# Patient Record
Sex: Male | Born: 1950 | Race: White | Hispanic: No | Marital: Single | Smoking: Current every day smoker
Health system: Southern US, Community
[De-identification: ages and names within clinical notes are randomized; demographics above are authoritative.]

## PROBLEM LIST (undated history)

## (undated) DIAGNOSIS — K279 Peptic ulcer, site unspecified, unspecified as acute or chronic, without hemorrhage or perforation: Secondary | ICD-10-CM

## (undated) DIAGNOSIS — K275 Chronic or unspecified peptic ulcer, site unspecified, with perforation: Secondary | ICD-10-CM

## (undated) HISTORY — PX: APPENDECTOMY: SHX54

---

## 2002-09-08 ENCOUNTER — Encounter: Payer: Self-pay | Admitting: Emergency Medicine

## 2002-09-08 ENCOUNTER — Inpatient Hospital Stay (HOSPITAL_COMMUNITY): Admission: EM | Admit: 2002-09-08 | Discharge: 2002-09-12 | Payer: Self-pay | Admitting: Emergency Medicine

## 2002-09-09 ENCOUNTER — Encounter (INDEPENDENT_AMBULATORY_CARE_PROVIDER_SITE_OTHER): Payer: Self-pay | Admitting: *Deleted

## 2011-01-21 NOTE — Discharge Summary (Signed)
NAME:  Chad Wiggins, Chad Wiggins NO.:  [id number]   MEDICAL RECORD NO.:  [id number]                   PATIENT TYPE:  INP   LOCATION:  5735                                 FACILITY:  [facility]   PHYSICIAN:  Sharlet Salina T. Hoxworth, M.D.          DATE OF BIRTH:  16-Jan-1951   DATE OF ADMISSION:  09/07/2002  DATE OF DISCHARGE:  09/12/2002                                 DISCHARGE SUMMARY   DISCHARGE DIAGNOSES:  Perforated appendicitis with abscess.   OPERATIONS AND PROCEDURES:  Appendectomy - September 08, 2002.   HISTORY OF PRESENT ILLNESS:  This patient is a 60 year old white male with a  six-day history of persistent right lower quadrant and periumbilical  abdominal pain, not severe.  He has been nauseated and vomited twice within  24 hours prior to admission.  Bowel movements have been normal.  He has felt  somewhat feverish.  He states he had similar pain about six months ago for  three days that resolved.   PAST MEDICAL HISTORY:  Without previous significant medical problems,  hospitalizations, or surgery.   MEDICATIONS:  None.   ALLERGIES:  CODEINE.   SOCIAL HISTORY, FAMILY HISTORY, REVIEW OF SYSTEMS:  See detailed H&P.   PERTINENT PHYSICAL EXAMINATION:  VITAL SIGNS:  He is afebrile and vital  signs all within normal limits.  GENERAL:  Well-nourished, well-developed white male in no distress.  ABDOMEN:  Showed some mild distension.  Moderate tenderness in the right  lower quadrant with some guarding and no mass.   LABORATORY DATA:  CBC, CMET, and urinalysis all within normal limits.  CT  scan of the abdomen and pelvis was obtained which showed an inflammatory  process around the tip of the cecum with about a 3 cm abscess consistent  with perforated appendectomy.  There was also evidence of small bowel  obstruction with dilated small bowel.   HOSPITAL COURSE:  The patient was taken to the operating room on day of  admission.  He was given broad-spectrum  antibiotics.  He underwent open  appendectomy with findings of perforated appendicitis with abscess and  kinking of the small bowel related to the inflammatory process resulting in  small bowel obstruction.  His postoperative course was relatively smooth.  He had some fever for 24-48 hours that gradually resolved.  He was treated  with IV Unasyn postoperatively.  He did have some urinary retention that  responded to Flomax after I&O catheterization for about 24 hours.  By  postoperative day #3 he had had a couple of loose bowel movements and had no  nausea, and was begun on clear liquids which he tolerated well.  He is  afebrile at this time.  By January 8 he was tolerating a diet and was  afebrile, abdomen nontender.  He was discharged home at this point.  Wound  was healing nicely.    FOLLOW-UP:  With me in my office in 7-10 days.  FINAL PATHOLOGY:  Confirmed acute appendicitis with perforation.                                               Chad Wiggins. Hoxworth, M.D.    Tory Emerald  D:  10/08/2002  T:  10/08/2002  Job:  [id number]

## 2011-01-21 NOTE — Op Note (Signed)
NAME:  Chad Wiggins, Chad Wiggins NO.:  [id number]   MEDICAL RECORD NO.:  [id number]                   PATIENT TYPE:  INP   LOCATION:  5735                                 FACILITY:  [facility]   PHYSICIAN:  Sharlet Salina T. Hoxworth, M.D.          DATE OF BIRTH:  1951/05/18   DATE OF PROCEDURE:  DATE OF DISCHARGE:                                 OPERATIVE REPORT   PREOPERATIVE DIAGNOSIS:  Perforated appendicitis with abscess.   POSTOPERATIVE DIAGNOSIS:  Perforated appendicitis with abscess.   SURGICAL PROCEDURE:  Appendectomy.   SURGEON:  [name]. Hoxworth, M.D.   ASSISTANT:  Jimmye Norman, M.D.   ANESTHESIA:  General.   BRIEF HISTORY:  The patient is a 60 year-old white male who presents with a  six day history of persistent right lower quadrant abdominal pain and  increasing nausea and vomiting.  He has had a CT scan revealing an  inflammatory process in the right lower quadrant with a 3 cm abscess  consistent with perforated appendicitis and abscess as well as evidence of a  small bowel obstruction.  Open appendectomy has been recommended and  accepted.  The nature of the procedure, its indications and risks of  bleeding and infection were discussed and understood properly.  He is now  brought to the operating room for this procedure.   DESCRIPTION OF PROCEDURE:  The patient was brought to the operating room,  placed in the supine position on the operating table and general  endotracheal anesthesia was induced.  He had received IV antibiotics  preoperatively. A Foley catheter was placed.  The abdomen was sterilely  prepped and draped.  An oblique incision was made in the right lower  quadrant and dissection was carried down through the subcutaneous tissue.  The external oblique was divided along the lines of its fibers.  The  internal oblique was bluntly split.  The transversus and perineum were  elevated and sharply entered.  There was a small amount of  clear fluid  drained.  I bluntly dissected the omentum at the right lower quadrant and  the abscess cavity entered with thick purulent material.  This was cultured.  This was all suctioned and irrigated.  In the center of the abscess was a  necrotic appendix.  This and the cecum elevated up quite easily and nicely  into the wound and the distal appendix with the cecum and proximal appendix  relatively free of any marked inflammation. The mesoappendix was dissected  away from the appendix and its base and divided between hemostats and tied  with 3-0 silk ties until the appendix was completely free down to its base.  At this point a 3-0 silk pursestring suture was placed around the base of  the appendix and the cecum and the appendix was clamped and tied with 2-0  chromic and divided and removed.  The stump was inverted and the pursestring  suture secured as  well as a Z stitch placed on top of this.  Following this  any further loculations were completely broken up in the right lower  quadrant and the right lower quadrant thoroughly irrigated with saline.  The  distal small bowel was carefully inspected and there was an area of  inflammatory adhesion which had been previously broken up about 6 or 7 cm  from the ileocecal valve which appeared to be the point of obstruction.  It  was confirmed that the small bowel was completely free at this point.  The  viscera were returned to their anatomic position.  The peritoneum and  transversus were closed with running 2-0 PDS.  The external oblique was  closed with running 0 PDS.  The soft  tissue was copiously irrigated with antibiotic solution.  The skin was  loosely closed with staples.  Sponge, needle and instrument counts were  correct.  A sterile bandage was applied.  The patient was taken to the  recovery room in good condition.                                               [name]. Hoxworth, M.D.    Tory Emerald  D:  09/08/2002  T:   09/08/2002  Job:  [id number]

## 2013-12-18 ENCOUNTER — Emergency Department (HOSPITAL_COMMUNITY): Payer: Non-veteran care

## 2013-12-18 ENCOUNTER — Emergency Department (HOSPITAL_COMMUNITY)
Admission: EM | Admit: 2013-12-18 | Discharge: 2013-12-18 | Disposition: A | Discharge status: Home / Self Care | Payer: Self-pay | Attending: Emergency Medicine | Admitting: Emergency Medicine

## 2013-12-18 ENCOUNTER — Encounter (HOSPITAL_COMMUNITY): Payer: Self-pay | Admitting: Emergency Medicine

## 2013-12-18 DIAGNOSIS — Y929 Unspecified place or not applicable: Secondary | ICD-10-CM | POA: Insufficient documentation

## 2013-12-18 DIAGNOSIS — Y939 Activity, unspecified: Secondary | ICD-10-CM | POA: Insufficient documentation

## 2013-12-18 DIAGNOSIS — Z79899 Other long term (current) drug therapy: Secondary | ICD-10-CM | POA: Insufficient documentation

## 2013-12-18 DIAGNOSIS — S20229A Contusion of unspecified back wall of thorax, initial encounter: Secondary | ICD-10-CM | POA: Insufficient documentation

## 2013-12-18 DIAGNOSIS — R0602 Shortness of breath: Secondary | ICD-10-CM | POA: Insufficient documentation

## 2013-12-18 DIAGNOSIS — W19XXXA Unspecified fall, initial encounter: Secondary | ICD-10-CM

## 2013-12-18 DIAGNOSIS — F17200 Nicotine dependence, unspecified, uncomplicated: Secondary | ICD-10-CM | POA: Insufficient documentation

## 2013-12-18 DIAGNOSIS — W11XXXA Fall on and from ladder, initial encounter: Secondary | ICD-10-CM | POA: Insufficient documentation

## 2013-12-18 MED ORDER — NAPROXEN 500 MG PO TABS: 500.0000 mg | ORAL_TABLET | Freq: Two times a day (BID) | ORAL | Status: DC

## 2013-12-18 MED ORDER — MORPHINE SULFATE 4 MG/ML IJ SOLN
4.0000 mg | INTRAMUSCULAR | Status: AC
  Administered 2013-12-18: 4 mg via INTRAMUSCULAR
  Filled 2013-12-18: qty 1

## 2013-12-18 NOTE — ED Provider Notes (Signed)
CSN: [id number]     Arrival date & time 12/18/13  0207 History   First MD Initiated Contact with Patient 12/18/13 [phone]     Chief Complaint  Patient presents with  . Fall     (Consider location/radiation/quality/duration/timing/severity/associated sxs/prior Treatment) HPI Comments: 63 year old male, states that he fell off a ladder at approximately 14-16 feet today. He landed flat on his back mostly on his left shoulder blade. He states that the breath got knocked out of him, he has had some shortness of breath and left upper back pain since that time, worse with moving his left arm and deep breathing. Symptoms are persistent, moderate to severe, not associated with head injury neck pain numbness weakness or difficulty walking.  Patient is a 63 y.o. male presenting with fall. The history is provided by the patient.  Fall    History reviewed. No pertinent past medical history. Past Surgical History  Procedure Laterality Date  . Appendectomy     No family history on file. History  Substance Use Topics  . Smoking status: Current Every Day Smoker -- 1.00 packs/day    Types: Cigarettes  . Smokeless tobacco: Not on file  . Alcohol Use: 10.8 oz/week    18 Cans of beer per week    Review of Systems  All other systems reviewed and are negative.     Allergies  Hydrocodone and Oxycodone  Home Medications   Prior to Admission medications   Medication Sig Start Date End Date Taking? Authorizing Provider  ALPRAZolam Prudy Feeler(XANAX) 0.5 MG tablet Take 0.5 mg by mouth once.   Yes Historical Provider, MD  Aspirin-Acetaminophen-Caffeine (GOODY HEADACHE PO) Take 1 packet by mouth 2 (two) times daily as needed (for pain).   Yes Historical Provider, MD  ibuprofen (ADVIL,MOTRIN) 200 MG tablet Take 200 mg by mouth every 6 (six) hours as needed for moderate pain.   Yes Historical Provider, MD   BP 142/105  Pulse 77  Temp(Src) 98.1 F (36.7 C) (Oral)  Resp 18  Ht 6\' 6"  (1.981 m)  Wt 215 lb  (97.523 kg)  BMI 24.85 kg/m2  SpO2 95% Physical Exam  Nursing note and vitals reviewed. Constitutional: He appears well-developed and well-nourished. No distress.  HENT:  Head: Normocephalic and atraumatic.  Mouth/Throat: Oropharynx is clear and moist. No oropharyngeal exudate.  no facial tenderness, deformity, malocclusion or hemotympanum.  no battle's sign or racoon eyes.   Eyes: Conjunctivae and EOM are normal. Pupils are equal, round, and reactive to light. Right eye exhibits no discharge. Left eye exhibits no discharge. No scleral icterus.  Neck: Normal range of motion. Neck supple. No JVD present. No thyromegaly present.  Cardiovascular: Normal rate, regular rhythm, normal heart sounds and intact distal pulses.  Exam reveals no gallop and no friction rub.   No murmur heard. Pulmonary/Chest: Effort normal and breath sounds normal. No respiratory distress. He has no wheezes. He has no rales.  Abdominal: Soft. Bowel sounds are normal. He exhibits no distension and no mass. There is no tenderness.  Musculoskeletal: Normal range of motion. He exhibits tenderness ( Focal tenderness to palpation over the left scapula, left rhomboid, no spinal tenderness over the cervical thoracic or lumbar spines.). He exhibits no edema.  Lymphadenopathy:    He has no cervical adenopathy.  Neurological: He is alert. Coordination normal.  Normal strength and sensation in all 4 extremities, gait normal  Skin: Skin is warm and dry. No rash noted. No erythema.  Psychiatric: He has a normal mood and  affect. His behavior is normal.    ED Course  Procedures (including critical care time) Labs Review Labs Reviewed - No data to display  Imaging Review Dg Ribs Unilateral W/chest Left  12/18/2013   CLINICAL DATA:  Patient fell off a ladder yesterday. Posterior rib pain.  EXAM: LEFT RIBS AND CHEST - 3+ VIEW  COMPARISON:  None.  FINDINGS: No fracture or other bone lesions are seen involving the ribs. There is no  evidence of pneumothorax or pleural effusion. There is bibasilar atelectasis. Both lungs are clear. Heart size and mediastinal contours are within normal limits.  IMPRESSION: No acute osseous injury of the left ribs.   Electronically Signed   By: Elige KoHetal  Patel   On: 12/18/2013 03:21   Dg Scapula Left  12/18/2013   CLINICAL DATA:  Patient fell from a ladder yesterday. Pain over the scapula.  EXAM: LEFT SCAPULA - 2+ VIEWS  COMPARISON:  None.  FINDINGS: There is no evidence of fracture or other focal bone lesions. Soft tissues are unremarkable.  IMPRESSION: Negative.   Electronically Signed   By: Elige KoHetal  Patel   On: 12/18/2013 03:21      MDM   Final diagnoses:  Contusion of back  Fall    Some shortness of breath with deep breathing, and no palpable crepitance over the spine or back, no subcutaneous emphysema, sats 95% on room air the patient does not want to take a deep breath. Imaging to rule out pneumothorax, rib fracture, scapular injury. Intramuscular morphine ordered, patient states he can take to safely  X-rays negative for fracture, pneumothorax or other acute injury. Medications given as below, patient states he has allergy to oxycodone and hydrocodone, he can't take anti-inflammatory safely. Sling provided for comfort   Meds given in ED:  Medications  morphine 4 MG/ML injection 4 mg (4 mg Intramuscular Given 12/18/13 0301)    New Prescriptions   NAPROXEN (NAPROSYN) 500 MG TABLET    Take 1 tablet (500 mg total) by mouth 2 (two) times daily with a meal.      Vida RollerBrian D Rishawn Walck, MD 12/18/13 [phone]

## 2013-12-18 NOTE — ED Notes (Signed)
Patient transported to X-ray 

## 2013-12-18 NOTE — ED Notes (Signed)
Per EMS, pt had fall from 16 feet ladder today at 1630. Pt reports that when he fell it knocked the breath out of him. The patient is now experiencing left shoulder pain and abd pain with movement and when coughing. Pt alert x 4. NAD at this time. [facility] but "the wait wait was to long."

## 2015-02-25 ENCOUNTER — Emergency Department (HOSPITAL_COMMUNITY): Payer: Non-veteran care

## 2015-02-25 ENCOUNTER — Inpatient Hospital Stay (HOSPITAL_COMMUNITY)
Admission: EM | Admit: 2015-02-25 | Discharge: 2015-03-04 | DRG: 327 | Disposition: A | Discharge status: Home / Self Care | Payer: Non-veteran care | Attending: General Surgery | Admitting: General Surgery

## 2015-02-25 ENCOUNTER — Encounter (HOSPITAL_COMMUNITY): Payer: Self-pay | Admitting: Emergency Medicine

## 2015-02-25 DIAGNOSIS — K279 Peptic ulcer, site unspecified, unspecified as acute or chronic, without hemorrhage or perforation: Secondary | ICD-10-CM | POA: Diagnosis present

## 2015-02-25 DIAGNOSIS — N289 Disorder of kidney and ureter, unspecified: Secondary | ICD-10-CM | POA: Diagnosis present

## 2015-02-25 DIAGNOSIS — K5900 Constipation, unspecified: Secondary | ICD-10-CM | POA: Diagnosis present

## 2015-02-25 DIAGNOSIS — K631 Perforation of intestine (nontraumatic): Secondary | ICD-10-CM

## 2015-02-25 DIAGNOSIS — R198 Other specified symptoms and signs involving the digestive system and abdomen: Secondary | ICD-10-CM | POA: Diagnosis present

## 2015-02-25 DIAGNOSIS — D72829 Elevated white blood cell count, unspecified: Secondary | ICD-10-CM | POA: Diagnosis present

## 2015-02-25 DIAGNOSIS — K255 Chronic or unspecified gastric ulcer with perforation: Secondary | ICD-10-CM | POA: Diagnosis not present

## 2015-02-25 DIAGNOSIS — E875 Hyperkalemia: Secondary | ICD-10-CM | POA: Diagnosis not present

## 2015-02-25 DIAGNOSIS — E876 Hypokalemia: Secondary | ICD-10-CM | POA: Diagnosis not present

## 2015-02-25 DIAGNOSIS — R6510 Systemic inflammatory response syndrome (SIRS) of non-infectious origin without acute organ dysfunction: Secondary | ICD-10-CM | POA: Diagnosis present

## 2015-02-25 DIAGNOSIS — R109 Unspecified abdominal pain: Secondary | ICD-10-CM

## 2015-02-25 DIAGNOSIS — E861 Hypovolemia: Secondary | ICD-10-CM | POA: Diagnosis present

## 2015-02-25 DIAGNOSIS — R1010 Upper abdominal pain, unspecified: Secondary | ICD-10-CM | POA: Diagnosis not present

## 2015-02-25 DIAGNOSIS — F17210 Nicotine dependence, cigarettes, uncomplicated: Secondary | ICD-10-CM | POA: Diagnosis present

## 2015-02-25 DIAGNOSIS — Z720 Tobacco use: Secondary | ICD-10-CM | POA: Diagnosis present

## 2015-02-25 DIAGNOSIS — N179 Acute kidney failure, unspecified: Secondary | ICD-10-CM | POA: Diagnosis present

## 2015-02-25 DIAGNOSIS — Z791 Long term (current) use of non-steroidal anti-inflammatories (NSAID): Secondary | ICD-10-CM

## 2015-02-25 HISTORY — DX: Chronic or unspecified peptic ulcer, site unspecified, with perforation: K27.5

## 2015-02-25 HISTORY — DX: Peptic ulcer, site unspecified, unspecified as acute or chronic, without hemorrhage or perforation: K27.9

## 2015-02-25 LAB — CBC WITH DIFFERENTIAL/PLATELET
Basophils Absolute: 0 10*3/uL (ref 0.0–0.1)
Basophils Relative: 0 % (ref 0–1)
EOS PCT: 0 % (ref 0–5)
Eosinophils Absolute: 0 10*3/uL (ref 0.0–0.7)
HCT: 53.7 % — ABNORMAL HIGH (ref 39.0–52.0)
Hemoglobin: 18.7 g/dL — ABNORMAL HIGH (ref 13.0–17.0)
LYMPHS ABS: 1.1 10*3/uL (ref 0.7–4.0)
LYMPHS PCT: 12 % (ref 12–46)
MCH: 33.2 pg (ref 26.0–34.0)
MCHC: 34.8 g/dL (ref 30.0–36.0)
MCV: 95.4 fL (ref 78.0–100.0)
Monocytes Absolute: 0.5 10*3/uL (ref 0.1–1.0)
Monocytes Relative: 6 % (ref 3–12)
Neutro Abs: 7.5 10*3/uL (ref 1.7–7.7)
Neutrophils Relative %: 82 % — ABNORMAL HIGH (ref 43–77)
PLATELETS: 227 10*3/uL (ref 150–400)
RBC: 5.63 MIL/uL (ref 4.22–5.81)
RDW: 13.2 % (ref 11.5–15.5)
WBC: 9.2 10*3/uL (ref 4.0–10.5)

## 2015-02-25 MED ORDER — HYDROMORPHONE HCL 1 MG/ML IJ SOLN
1.0000 mg | Freq: Once | INTRAMUSCULAR | Status: AC
  Administered 2015-02-25: 1 mg via INTRAVENOUS
  Filled 2015-02-25: qty 1

## 2015-02-25 MED ORDER — HYDROMORPHONE HCL 1 MG/ML IJ SOLN
1.0000 mg | Freq: Once | INTRAMUSCULAR | Status: AC
  Administered 2015-02-26: 1 mg via INTRAVENOUS
  Filled 2015-02-25: qty 1

## 2015-02-25 MED ORDER — FENTANYL CITRATE (PF) 100 MCG/2ML IJ SOLN
50.0000 ug | Freq: Once | INTRAMUSCULAR | Status: AC
  Administered 2015-02-25: 50 ug via INTRAVENOUS
  Filled 2015-02-25: qty 2

## 2015-02-25 MED ORDER — SODIUM CHLORIDE 0.9 % IV BOLUS (SEPSIS)
500.0000 mL | Freq: Once | INTRAVENOUS | Status: AC
  Administered 2015-02-25: 500 mL via INTRAVENOUS

## 2015-02-25 NOTE — ED Notes (Signed)
MD Yelverton at bedside. 

## 2015-02-25 NOTE — ED Provider Notes (Signed)
CSN: [id number]     Arrival date & time 02/25/15  2214 History  This chart was scribed for Loren Racer, MD by Bronson Curb, ED Scribe. This patient was seen in room D34C/D34C and the patient's care was started at 11:08 PM.   Chief Complaint  Patient presents with  . Abdominal Pain    The history is provided by the patient. No language interpreter was used.     HPI Comments: Chad Wiggins is a 64 y.o. male who presents to the Emergency Department complaining of gradually worsening, upper abdominal pain for the past few weeks, that has gotten worse in the last 5 hours and become more diffuse. There is associated nausea, vomiting, abdominal distention, and diaphoresis. Patient has not been able to tolerate PO and denies flatulence. He reports his last BM was "a couple of days ago". He denies fever, chills, or diarrhea. History of prior appendectomy.   History reviewed. No pertinent past medical history. Past Surgical History  Procedure Laterality Date  . Appendectomy     History reviewed. No pertinent family history. History  Substance Use Topics  . Smoking status: Current Every Day Smoker -- 1.00 packs/day    Types: Cigarettes  . Smokeless tobacco: Not on file  . Alcohol Use: 10.8 oz/week    18 Cans of beer per week    Review of Systems  Constitutional: Negative for fever and chills.  Respiratory: Negative for shortness of breath.   Cardiovascular: Negative for chest pain.  Gastrointestinal: Positive for nausea, vomiting, abdominal pain, constipation and abdominal distention.  Genitourinary: Negative for dysuria, frequency and flank pain.  Musculoskeletal: Negative for back pain, neck pain and neck stiffness.  Skin: Negative for rash and wound.  Neurological: Negative for dizziness, weakness, light-headedness, numbness and headaches.  All other systems reviewed and are negative.     Allergies  Hydrocodone and Oxycodone  Home Medications   Prior to Admission  medications   Medication Sig Start Date End Date Taking? Authorizing Provider  ALPRAZolam Prudy Feeler) 0.5 MG tablet Take 0.5 mg by mouth once.    Historical Provider, MD  Aspirin-Acetaminophen-Caffeine (GOODY HEADACHE PO) Take 1 packet by mouth 2 (two) times daily as needed (for pain).    Historical Provider, MD  ibuprofen (ADVIL,MOTRIN) 200 MG tablet Take 200 mg by mouth every 6 (six) hours as needed for moderate pain.    Historical Provider, MD  naproxen (NAPROSYN) 500 MG tablet Take 1 tablet (500 mg total) by mouth 2 (two) times daily with a meal. 12/18/13   Eber Hong, MD   Triage Vitals: BP 136/100 mmHg  Pulse 122  Temp(Src) 97.5 F (36.4 C) (Oral)  Resp 18  SpO2 96%  Physical Exam  Constitutional: He is oriented to person, place, and time. He appears well-developed and well-nourished. He appears distressed.  Sitting up. Uncomfortable appearing  HENT:  Head: Normocephalic and atraumatic.  Mouth/Throat: Oropharynx is clear and moist.  Eyes: EOM are normal. Pupils are equal, round, and reactive to light.  Neck: Normal range of motion. Neck supple.  Cardiovascular: Normal rate and regular rhythm.   Pulmonary/Chest: Effort normal and breath sounds normal. No respiratory distress. He has no wheezes. He has no rales.  Abdominal: Soft. He exhibits distension. There is tenderness.  Decreased bowel sounds throughout. Abdomen is distended and firm. Diffuse tenderness. No definite peritoneal signs  Musculoskeletal: Normal range of motion. He exhibits no edema or tenderness.  No CVA tenderness bilaterally  Neurological: He is alert and oriented to person,  place, and time.  Moves all extremities without deficit. Sensation is grossly intact.  Skin: Skin is warm and dry. No rash noted. No erythema.  Psychiatric: He has a normal mood and affect. His behavior is normal.  Nursing note and vitals reviewed.   ED Course  Procedures (including critical care time)  DIAGNOSTIC STUDIES: Oxygen  Saturation is 96% on room air, adequate by my interpretation.    COORDINATION OF CARE: At 2312 Discussed treatment plan with patient which includes pain medication. Patient agrees.   Labs Review Labs Reviewed  CBC WITH DIFFERENTIAL/PLATELET - Abnormal; Notable for the following:    Hemoglobin 18.7 (*)    HCT 53.7 (*)    Neutrophils Relative % 82 (*)    All other components within normal limits  COMPREHENSIVE METABOLIC PANEL - Abnormal; Notable for the following:    CO2 21 (*)    Glucose, Bld 161 (*)    BUN 24 (*)    Creatinine, Ser 2.06 (*)    Calcium 10.4 (*)    Total Protein 8.5 (*)    GFR calc non Af Amer 33 (*)    GFR calc Af Amer 38 (*)    All other components within normal limits  LIPASE, BLOOD  OCCULT BLOOD GASTRIC / DUODENUM (SPECIMEN CUP)  URINALYSIS, ROUTINE W REFLEX MICROSCOPIC (NOT AT [facility])    Imaging Review Ct Abdomen Pelvis Wo Contrast  02/26/2015   CLINICAL DATA:  Subacute onset of generalized abdominal pain for 2 weeks. Nausea, vomiting and abdominal distention. Initial encounter.  EXAM: CT ABDOMEN AND PELVIS WITHOUT CONTRAST  TECHNIQUE: Multidetector CT imaging of the abdomen and pelvis was performed following the standard protocol without IV contrast.  COMPARISON:  None.  FINDINGS: Mild bibasilar atelectasis or scarring is seen.  Significant free intra-abdominal air is noted within the abdomen, with free fluid of borderline high attenuation tracking about the liver and in the pelvis. This is compatible with perforation. The site of perforation appears to be at the anterior aspect of the antrum of the stomach. An underlying ulceration cannot be excluded.  Scattered nonspecific hypodensities within the liver measure up to 1.6 cm in size. The spleen is unremarkable in appearance. The pancreas and adrenal glands are within normal limits. The gallbladder is grossly unremarkable.  The kidneys are unremarkable in appearance. There is no evidence of hydronephrosis. No renal or  ureteral stones are seen. No perinephric stranding is appreciated.  No free fluid is identified. The small bowel is unremarkable in appearance. The stomach is within normal limits. No acute vascular abnormalities are seen.  The appendix is not definitely characterized; there is no evidence of appendicitis. The colon is unremarkable in appearance.  The bladder is mildly distended and grossly unremarkable. The prostate is normal in size. No inguinal lymphadenopathy is seen.  No acute osseous abnormalities are identified.  IMPRESSION: 1. Acute bowel perforation, with significant free intra-abdominal air and a small to moderate amount of free fluid about the liver and in the pelvis. This appears to arise from the anterior aspect of the antrum of the stomach. An underlying ulceration cannot be excluded. 2. Mild bibasilar atelectasis or scarring noted. 3. Nonspecific hypodensities within the liver, measuring up to 1.6 cm in size. The LFTs remain normal; dynamic liver protocol MRI or CT could be considered, on an elective nonemergent basis.  Critical Value/emergent results were called by telephone at the time of interpretation on 02/26/2015 at 2:42 am to Dr. Loren Racer, who verbally acknowledged these results.  Electronically Signed   By: Roanna Raider M.D.   On: 02/26/2015 02:50   Dg Abd Portable 1v  02/26/2015   CLINICAL DATA:  Enteric tube repositioning.  Initial encounter.  EXAM: PORTABLE ABDOMEN - 1 VIEW  COMPARISON:  Abdominal radiograph performed earlier today at 11:37 p.m.  FINDINGS: The patient's enteric tube is noted ending overlying the body of the stomach, though it extends somewhat more lateral than expected.  The visualized bowel gas pattern is grossly unremarkable. A small to moderate amount of stool is noted in the colon.  Crescentic air is again noted under the left hemidiaphragm. A decubitus view would be helpful for further evaluation.  No acute osseous abnormalities are seen. Mild right basilar  opacities likely reflect atelectasis.  IMPRESSION: 1. Enteric tube noted ending overlying the body of the stomach, though it extends somewhat more lateral than expected. 2. Crescentic air again noted underlying the left hemidiaphragm. A decubitus view would be helpful for further evaluation, to assess for free intra-abdominal air.  The findings were discussed at 12:48 a.m. on 02/26/2015 with [name] RN, by Dr. Earlene Plater.   Electronically Signed   By: Roanna Raider M.D.   On: 02/26/2015 01:02   Dg Abd Portable 1v  02/26/2015   CLINICAL DATA:  NG tube placement.  EXAM: PORTABLE ABDOMEN - 1 VIEW  COMPARISON:  None.  FINDINGS: NG tube is coiled within the distal esophagus. Nonobstructed bowel gas pattern. Stool throughout the colon. Crescentic lucency under the left hemidiaphragm. Regional skeleton unremarkable.  IMPRESSION: Crescentic lucency under the left hemidiaphragm, potentially representing free intraperitoneal air. Recommend further evaluation with dedicated abdominal radiograph and decubitus views.  NG tube is coiled within the esophagus.  Recommend repositioning.  These results were called by telephone at the time of interpretation on 02/26/2015 at 12:48 am to [name], RN, who verbally acknowledged these results.   Electronically Signed   By: Annia Belt M.D.   On: 02/26/2015 00:49     EKG Interpretation   Date/Time:  Thursday February 26 2015 02:45:25 EDT Ventricular Rate:  120 PR Interval:  170 QRS Duration: 82 QT Interval:  307 QTC Calculation: 434 R Axis:   104 Text Interpretation:  Sinus tachycardia Left atrial enlargement Right axis  deviation Confirmed by Ranae Palms  MD, [place] Lohmeyer ([id number]) on 02/26/2015 2:58:08  AM     CRITICAL CARE Performed by: Ranae Palms, Dajana Gehrig Total critical care time: 20  min Critical care time was exclusive of separately billable procedures and treating other patients. Critical care was necessary to treat or prevent imminent or life-threatening  deterioration. Critical care was time spent personally by me on the following activities: development of treatment plan with patient and/or surrogate as well as nursing, discussions with consultants, evaluation of patient's response to treatment, examination of patient, obtaining history from patient or surrogate, ordering and performing treatments and interventions, ordering and review of laboratory studies, ordering and review of radiographic studies, pulse oximetry and re-evaluation of patient's condition.  MDM   Final diagnoses:  Abdominal pain  Bowel perforation    I personally performed the services described in this documentation, which was scribed in my presence. The recorded information has been reviewed and is accurate.  Patient with evidence of bowel obstruction but perforation is also a possibility. CT scan of the abdomen. Patient's pain will be treated with IV Dilaudid. Also will place NG tube for symptomatic relief.  Further questioning patient states that he developed upper abdominal pain after heavy drinking. He has been  taking Goody powders for the abdominal pain. CT scan consistent with acute perforation. Suspect the side of the perforation is the antrum of the stomach. Discussed with Dr. Donell Beers. She will admit for surgery. IV antibiotic started in the emergency department.   Loren Racer, MD 02/26/15 [phone]

## 2015-02-25 NOTE — ED Notes (Signed)
Pt arrives with abdominal pain ongoing for two weeks, worse today. States at 1900, sudden sensation of pain dropping from upper abdomen to lower abdomen. Unable to sit still in triage. 4MG  Zofran given by EMS. N/V, constipation.

## 2015-02-26 ENCOUNTER — Emergency Department (HOSPITAL_COMMUNITY): Payer: Non-veteran care | Admitting: Certified Registered"

## 2015-02-26 ENCOUNTER — Emergency Department (HOSPITAL_COMMUNITY): Payer: Non-veteran care

## 2015-02-26 ENCOUNTER — Encounter (HOSPITAL_COMMUNITY): Admission: EM | Disposition: A | Discharge status: Home / Self Care | Payer: Self-pay | Source: Home / Self Care

## 2015-02-26 ENCOUNTER — Encounter (HOSPITAL_COMMUNITY): Payer: Self-pay | Admitting: Radiology

## 2015-02-26 DIAGNOSIS — K275 Chronic or unspecified peptic ulcer, site unspecified, with perforation: Secondary | ICD-10-CM

## 2015-02-26 DIAGNOSIS — D72829 Elevated white blood cell count, unspecified: Secondary | ICD-10-CM | POA: Diagnosis present

## 2015-02-26 DIAGNOSIS — K255 Chronic or unspecified gastric ulcer with perforation: Secondary | ICD-10-CM | POA: Diagnosis present

## 2015-02-26 DIAGNOSIS — E875 Hyperkalemia: Secondary | ICD-10-CM | POA: Diagnosis not present

## 2015-02-26 DIAGNOSIS — F17210 Nicotine dependence, cigarettes, uncomplicated: Secondary | ICD-10-CM | POA: Diagnosis present

## 2015-02-26 DIAGNOSIS — Z791 Long term (current) use of non-steroidal anti-inflammatories (NSAID): Secondary | ICD-10-CM | POA: Diagnosis not present

## 2015-02-26 DIAGNOSIS — N179 Acute kidney failure, unspecified: Secondary | ICD-10-CM | POA: Diagnosis present

## 2015-02-26 DIAGNOSIS — K5900 Constipation, unspecified: Secondary | ICD-10-CM | POA: Diagnosis present

## 2015-02-26 DIAGNOSIS — R1010 Upper abdominal pain, unspecified: Secondary | ICD-10-CM | POA: Diagnosis present

## 2015-02-26 DIAGNOSIS — E876 Hypokalemia: Secondary | ICD-10-CM | POA: Diagnosis not present

## 2015-02-26 DIAGNOSIS — K279 Peptic ulcer, site unspecified, unspecified as acute or chronic, without hemorrhage or perforation: Secondary | ICD-10-CM

## 2015-02-26 DIAGNOSIS — E861 Hypovolemia: Secondary | ICD-10-CM | POA: Diagnosis present

## 2015-02-26 DIAGNOSIS — R6510 Systemic inflammatory response syndrome (SIRS) of non-infectious origin without acute organ dysfunction: Secondary | ICD-10-CM | POA: Diagnosis present

## 2015-02-26 DIAGNOSIS — N289 Disorder of kidney and ureter, unspecified: Secondary | ICD-10-CM | POA: Diagnosis present

## 2015-02-26 DIAGNOSIS — R198 Other specified symptoms and signs involving the digestive system and abdomen: Secondary | ICD-10-CM | POA: Diagnosis present

## 2015-02-26 HISTORY — DX: Peptic ulcer, site unspecified, unspecified as acute or chronic, without hemorrhage or perforation: K27.9

## 2015-02-26 HISTORY — PX: LAPAROTOMY: SHX154

## 2015-02-26 HISTORY — DX: Chronic or unspecified peptic ulcer, site unspecified, with perforation: K27.5

## 2015-02-26 LAB — URINALYSIS, ROUTINE W REFLEX MICROSCOPIC
BILIRUBIN URINE: NEGATIVE
GLUCOSE, UA: NEGATIVE mg/dL
KETONES UR: NEGATIVE mg/dL
LEUKOCYTES UA: NEGATIVE
Nitrite: NEGATIVE
PROTEIN: NEGATIVE mg/dL
Specific Gravity, Urine: 1.024 (ref 1.005–1.030)
Urobilinogen, UA: 0.2 mg/dL (ref 0.0–1.0)
pH: 5 (ref 5.0–8.0)

## 2015-02-26 LAB — COMPREHENSIVE METABOLIC PANEL
ALBUMIN: 4.6 g/dL (ref 3.5–5.0)
ALT: 40 U/L (ref 17–63)
AST: 36 U/L (ref 15–41)
Alkaline Phosphatase: 117 U/L (ref 38–126)
Anion gap: 15 (ref 5–15)
BUN: 24 mg/dL — ABNORMAL HIGH (ref 6–20)
CO2: 21 mmol/L — ABNORMAL LOW (ref 22–32)
Calcium: 10.4 mg/dL — ABNORMAL HIGH (ref 8.9–10.3)
Chloride: 105 mmol/L (ref 101–111)
Creatinine, Ser: 2.06 mg/dL — ABNORMAL HIGH (ref 0.61–1.24)
GFR calc non Af Amer: 33 mL/min — ABNORMAL LOW (ref >60)
GFR, EST AFRICAN AMERICAN: 38 mL/min — AB (ref >60)
Glucose, Bld: 161 mg/dL — ABNORMAL HIGH (ref 65–99)
POTASSIUM: 3.6 mmol/L (ref 3.5–5.1)
SODIUM: 141 mmol/L (ref 135–145)
TOTAL PROTEIN: 8.5 g/dL — AB (ref 6.5–8.1)
Total Bilirubin: 0.7 mg/dL (ref 0.3–1.2)

## 2015-02-26 LAB — URINE MICROSCOPIC-ADD ON

## 2015-02-26 LAB — CREATININE, SERUM
Creatinine, Ser: 2.1 mg/dL — ABNORMAL HIGH (ref 0.61–1.24)
GFR calc Af Amer: 37 mL/min — ABNORMAL LOW (ref >60)
GFR, EST NON AFRICAN AMERICAN: 32 mL/min — AB (ref >60)

## 2015-02-26 LAB — CBC
HEMATOCRIT: 47.9 % (ref 39.0–52.0)
Hemoglobin: 16 g/dL (ref 13.0–17.0)
MCH: 32.3 pg (ref 26.0–34.0)
MCHC: 33.4 g/dL (ref 30.0–36.0)
MCV: 96.8 fL (ref 78.0–100.0)
Platelets: 177 10*3/uL (ref 150–400)
RBC: 4.95 MIL/uL (ref 4.22–5.81)
RDW: 13.6 % (ref 11.5–15.5)
WBC: 8.5 10*3/uL (ref 4.0–10.5)

## 2015-02-26 LAB — GLUCOSE, CAPILLARY: Glucose-Capillary: 147 mg/dL — ABNORMAL HIGH (ref 65–99)

## 2015-02-26 LAB — LIPASE, BLOOD: Lipase: 32 U/L (ref 22–51)

## 2015-02-26 LAB — MRSA PCR SCREENING: MRSA by PCR: NEGATIVE

## 2015-02-26 LAB — OCCULT BLOOD GASTRIC / DUODENUM (SPECIMEN CUP): Occult Blood, Gastric: NEGATIVE

## 2015-02-26 SURGERY — LAPAROTOMY, EXPLORATORY
Anesthesia: General | Site: Abdomen

## 2015-02-26 MED ORDER — DEXAMETHASONE SODIUM PHOSPHATE 4 MG/ML IJ SOLN
INTRAMUSCULAR | Status: DC | PRN
  Administered 2015-02-26: 4 mg via INTRAVENOUS

## 2015-02-26 MED ORDER — CETYLPYRIDINIUM CHLORIDE 0.05 % MT LIQD
7.0000 mL | Freq: Two times a day (BID) | OROMUCOSAL | Status: DC
  Administered 2015-02-26 – 2015-03-01 (×7): 7 mL via OROMUCOSAL

## 2015-02-26 MED ORDER — PIPERACILLIN-TAZOBACTAM 3.375 G IVPB 30 MIN
3.3750 g | Freq: Once | INTRAVENOUS | Status: AC
  Administered 2015-02-26: 3.375 g via INTRAVENOUS
  Filled 2015-02-26: qty 50

## 2015-02-26 MED ORDER — SODIUM CHLORIDE 0.9 % IV BOLUS (SEPSIS)
500.0000 mL | Freq: Once | INTRAVENOUS | Status: AC
  Administered 2015-02-26: 500 mL via INTRAVENOUS

## 2015-02-26 MED ORDER — HYDROMORPHONE HCL 1 MG/ML IJ SOLN: 1.0000 mg | INTRAMUSCULAR | Status: DC | PRN

## 2015-02-26 MED ORDER — HYDROMORPHONE 0.3 MG/ML IV SOLN
INTRAVENOUS | Status: DC
  Administered 2015-02-26: 05:00:00 via INTRAVENOUS
  Administered 2015-02-26: 2.4 mg via INTRAVENOUS
  Administered 2015-02-26: 0.6 mg via INTRAVENOUS
  Administered 2015-02-26: 1.75 mg via INTRAVENOUS
  Administered 2015-02-26 – 2015-02-27 (×3): 2.1 mg via INTRAVENOUS
  Administered 2015-02-27: 1.5 mg via INTRAVENOUS
  Administered 2015-02-27: 1.8 mg via INTRAVENOUS
  Administered 2015-02-27: 2.1 mg via INTRAVENOUS
  Administered 2015-02-27: 1.5 mg via INTRAVENOUS
  Administered 2015-02-27: 11:00:00 via INTRAVENOUS
  Administered 2015-02-28: 1.8 mg via INTRAVENOUS
  Administered 2015-02-28: 2.9 mg via INTRAVENOUS
  Administered 2015-02-28: 1 mg via INTRAVENOUS
  Administered 2015-02-28: 2.1 mg via INTRAVENOUS
  Administered 2015-02-28: 14:00:00 via INTRAVENOUS
  Administered 2015-02-28 (×2): 2.1 mg via INTRAVENOUS
  Administered 2015-03-01: 1 mg via INTRAVENOUS
  Administered 2015-03-01: 0.3 mg via INTRAVENOUS
  Administered 2015-03-01: 1 mg via INTRAVENOUS
  Administered 2015-03-01: 1.8 mg via INTRAVENOUS
  Administered 2015-03-01: 1 mg via INTRAVENOUS
  Administered 2015-03-01: 5.1 mg via INTRAVENOUS
  Administered 2015-03-02 (×2): 1 mg via INTRAVENOUS
  Filled 2015-02-26 (×7): qty 25

## 2015-02-26 MED ORDER — LIDOCAINE HCL (CARDIAC) 20 MG/ML IV SOLN
INTRAVENOUS | Status: AC
  Filled 2015-02-26: qty 5

## 2015-02-26 MED ORDER — FLUCONAZOLE IN SODIUM CHLORIDE 200-0.9 MG/100ML-% IV SOLN
200.0000 mg | INTRAVENOUS | Status: DC
  Administered 2015-02-26 – 2015-03-04 (×7): 200 mg via INTRAVENOUS
  Filled 2015-02-26 (×8): qty 100

## 2015-02-26 MED ORDER — NEOSTIGMINE METHYLSULFATE 10 MG/10ML IV SOLN
INTRAVENOUS | Status: DC | PRN
  Administered 2015-02-26: 3 mg via INTRAVENOUS

## 2015-02-26 MED ORDER — SODIUM CHLORIDE 0.9 % IV SOLN
INTRAVENOUS | Status: DC | PRN
  Administered 2015-02-26: 03:00:00 via INTRAVENOUS

## 2015-02-26 MED ORDER — ONDANSETRON HCL 4 MG/2ML IJ SOLN
INTRAMUSCULAR | Status: AC
  Filled 2015-02-26: qty 2

## 2015-02-26 MED ORDER — GLYCOPYRROLATE 0.2 MG/ML IJ SOLN
INTRAMUSCULAR | Status: DC | PRN
  Administered 2015-02-26: 0.4 mg via INTRAVENOUS

## 2015-02-26 MED ORDER — DIPHENHYDRAMINE HCL 12.5 MG/5ML PO ELIX
12.5000 mg | ORAL_SOLUTION | Freq: Four times a day (QID) | ORAL | Status: DC | PRN
  Filled 2015-02-26: qty 5

## 2015-02-26 MED ORDER — HYDROMORPHONE 0.3 MG/ML IV SOLN
INTRAVENOUS | Status: AC
  Filled 2015-02-26: qty 25

## 2015-02-26 MED ORDER — HYDROMORPHONE HCL 1 MG/ML IJ SOLN
1.0000 mg | Freq: Once | INTRAMUSCULAR | Status: AC
  Administered 2015-02-26: 1 mg via INTRAVENOUS
  Filled 2015-02-26: qty 1

## 2015-02-26 MED ORDER — ONDANSETRON HCL 4 MG/2ML IJ SOLN: 4.0000 mg | Freq: Four times a day (QID) | INTRAMUSCULAR | Status: DC | PRN

## 2015-02-26 MED ORDER — DEXAMETHASONE SODIUM PHOSPHATE 4 MG/ML IJ SOLN
INTRAMUSCULAR | Status: AC
  Filled 2015-02-26: qty 1

## 2015-02-26 MED ORDER — HYDROMORPHONE HCL 1 MG/ML IJ SOLN
INTRAMUSCULAR | Status: AC
  Filled 2015-02-26: qty 1

## 2015-02-26 MED ORDER — LACTATED RINGERS IV SOLN
INTRAVENOUS | Status: DC | PRN
  Administered 2015-02-26 (×2): via INTRAVENOUS

## 2015-02-26 MED ORDER — SUFENTANIL CITRATE 50 MCG/ML IV SOLN
INTRAVENOUS | Status: DC | PRN
  Administered 2015-02-26: 10 ug via INTRAVENOUS

## 2015-02-26 MED ORDER — GLYCOPYRROLATE 0.2 MG/ML IJ SOLN
INTRAMUSCULAR | Status: AC
  Filled 2015-02-26: qty 2

## 2015-02-26 MED ORDER — PIPERACILLIN-TAZOBACTAM 3.375 G IVPB
3.3750 g | Freq: Three times a day (TID) | INTRAVENOUS | Status: DC
  Administered 2015-02-26 – 2015-03-04 (×19): 3.375 g via INTRAVENOUS
  Filled 2015-02-26 (×22): qty 50

## 2015-02-26 MED ORDER — DEXTROSE-NACL 5-0.45 % IV SOLN
INTRAVENOUS | Status: DC
  Administered 2015-02-26 – 2015-02-28 (×6): via INTRAVENOUS
  Administered 2015-03-01 (×2): 1 mL via INTRAVENOUS
  Administered 2015-03-02 – 2015-03-04 (×2): via INTRAVENOUS

## 2015-02-26 MED ORDER — PROMETHAZINE HCL 25 MG/ML IJ SOLN
6.2500 mg | INTRAMUSCULAR | Status: DC | PRN
  Filled 2015-02-26: qty 1

## 2015-02-26 MED ORDER — SUFENTANIL CITRATE 50 MCG/ML IV SOLN
INTRAVENOUS | Status: AC
  Filled 2015-02-26: qty 1

## 2015-02-26 MED ORDER — PANTOPRAZOLE SODIUM 40 MG IV SOLR
40.0000 mg | Freq: Every day | INTRAVENOUS | Status: DC
  Administered 2015-02-26 – 2015-02-27 (×2): 40 mg via INTRAVENOUS
  Filled 2015-02-26 (×2): qty 40

## 2015-02-26 MED ORDER — DIPHENHYDRAMINE HCL 50 MG/ML IJ SOLN: 12.5000 mg | Freq: Four times a day (QID) | INTRAMUSCULAR | Status: DC | PRN

## 2015-02-26 MED ORDER — SODIUM CHLORIDE 0.9 % IJ SOLN: 9.0000 mL | INTRAMUSCULAR | Status: DC | PRN

## 2015-02-26 MED ORDER — PROPOFOL 10 MG/ML IV BOLUS
INTRAVENOUS | Status: AC
  Filled 2015-02-26: qty 20

## 2015-02-26 MED ORDER — VANCOMYCIN HCL IN DEXTROSE 750-5 MG/150ML-% IV SOLN: 750.0000 mg | Freq: Two times a day (BID) | INTRAVENOUS | Status: DC

## 2015-02-26 MED ORDER — SODIUM CHLORIDE 0.9 % IJ SOLN
INTRAMUSCULAR | Status: AC
  Filled 2015-02-26: qty 10

## 2015-02-26 MED ORDER — SUCCINYLCHOLINE CHLORIDE 20 MG/ML IJ SOLN
INTRAMUSCULAR | Status: AC
  Filled 2015-02-26: qty 1

## 2015-02-26 MED ORDER — HEPARIN SODIUM (PORCINE) 5000 UNIT/ML IJ SOLN
5000.0000 [IU] | Freq: Three times a day (TID) | INTRAMUSCULAR | Status: DC
  Administered 2015-02-27 – 2015-03-04 (×16): 5000 [IU] via SUBCUTANEOUS
  Filled 2015-02-26 (×14): qty 1

## 2015-02-26 MED ORDER — HYDROMORPHONE HCL 1 MG/ML IJ SOLN
0.2500 mg | INTRAMUSCULAR | Status: DC | PRN
  Administered 2015-02-26 (×2): 0.5 mg via INTRAVENOUS

## 2015-02-26 MED ORDER — 0.9 % SODIUM CHLORIDE (POUR BTL) OPTIME
TOPICAL | Status: DC | PRN
  Administered 2015-02-26 (×12): 1000 mL

## 2015-02-26 MED ORDER — NALOXONE HCL 0.4 MG/ML IJ SOLN: 0.4000 mg | INTRAMUSCULAR | Status: DC | PRN

## 2015-02-26 MED ORDER — MIDAZOLAM HCL 5 MG/5ML IJ SOLN
INTRAMUSCULAR | Status: DC | PRN
  Administered 2015-02-26: 2 mg via INTRAVENOUS

## 2015-02-26 MED ORDER — ROCURONIUM BROMIDE 50 MG/5ML IV SOLN
INTRAVENOUS | Status: AC
  Filled 2015-02-26: qty 1

## 2015-02-26 MED ORDER — NEOSTIGMINE METHYLSULFATE 10 MG/10ML IV SOLN
INTRAVENOUS | Status: AC
  Filled 2015-02-26: qty 1

## 2015-02-26 MED ORDER — ONDANSETRON HCL 4 MG/2ML IJ SOLN
INTRAMUSCULAR | Status: DC | PRN
  Administered 2015-02-26: 4 mg via INTRAVENOUS

## 2015-02-26 MED ORDER — MIDAZOLAM HCL 2 MG/2ML IJ SOLN
INTRAMUSCULAR | Status: AC
  Filled 2015-02-26: qty 2

## 2015-02-26 MED ORDER — SODIUM CHLORIDE 0.9 % IV BOLUS (SEPSIS): 1000.0000 mL | Freq: Once | INTRAVENOUS | Status: DC

## 2015-02-26 MED ORDER — SUCCINYLCHOLINE CHLORIDE 20 MG/ML IJ SOLN
INTRAMUSCULAR | Status: DC | PRN
  Administered 2015-02-26: 120 mg via INTRAVENOUS

## 2015-02-26 MED ORDER — ROCURONIUM BROMIDE 100 MG/10ML IV SOLN
INTRAVENOUS | Status: DC | PRN
  Administered 2015-02-26: 30 mg via INTRAVENOUS
  Administered 2015-02-26: 20 mg via INTRAVENOUS

## 2015-02-26 MED ORDER — CHLORHEXIDINE GLUCONATE 0.12 % MT SOLN
15.0000 mL | Freq: Two times a day (BID) | OROMUCOSAL | Status: DC
  Administered 2015-02-26 – 2015-03-02 (×9): 15 mL via OROMUCOSAL
  Filled 2015-02-26 (×9): qty 15

## 2015-02-26 MED ORDER — PROPOFOL 10 MG/ML IV BOLUS
INTRAVENOUS | Status: DC | PRN
  Administered 2015-02-26: 170 mg via INTRAVENOUS

## 2015-02-26 MED ORDER — ONDANSETRON HCL 4 MG PO TABS: 4.0000 mg | ORAL_TABLET | Freq: Four times a day (QID) | ORAL | Status: DC | PRN

## 2015-02-26 MED ORDER — VANCOMYCIN HCL IN DEXTROSE 750-5 MG/150ML-% IV SOLN
750.0000 mg | Freq: Once | INTRAVENOUS | Status: DC
  Administered 2015-02-26: 750 mg via INTRAVENOUS
  Filled 2015-02-26: qty 150

## 2015-02-26 MED ORDER — LIDOCAINE HCL (CARDIAC) 20 MG/ML IV SOLN
INTRAVENOUS | Status: DC | PRN
  Administered 2015-02-26: 100 mg via INTRAVENOUS

## 2015-02-26 SURGICAL SUPPLY — 60 items
BLADE SURG ROTATE 9660 (MISCELLANEOUS) ×3 IMPLANT
BNDG GAUZE ELAST 4 BULKY (GAUZE/BANDAGES/DRESSINGS) ×3 IMPLANT
CANISTER SUCTION 2500CC (MISCELLANEOUS) ×3 IMPLANT
CHLORAPREP W/TINT 26ML (MISCELLANEOUS) ×3 IMPLANT
COVER MAYO STAND STRL (DRAPES) ×3 IMPLANT
COVER SURGICAL LIGHT HANDLE (MISCELLANEOUS) ×3 IMPLANT
DRAPE LAPAROSCOPIC ABDOMINAL (DRAPES) ×3 IMPLANT
DRAPE PROXIMA HALF (DRAPES) ×3 IMPLANT
DRAPE UTILITY XL STRL (DRAPES) ×6 IMPLANT
DRAPE WARM FLUID 44X44 (DRAPE) ×3 IMPLANT
DRSG OPSITE POSTOP 4X10 (GAUZE/BANDAGES/DRESSINGS) IMPLANT
DRSG OPSITE POSTOP 4X8 (GAUZE/BANDAGES/DRESSINGS) IMPLANT
DRSG PAD ABDOMINAL 8X10 ST (GAUZE/BANDAGES/DRESSINGS) ×3 IMPLANT
ELECT BLADE 6.5 EXT (BLADE) ×3 IMPLANT
ELECT CAUTERY BLADE 6.4 (BLADE) ×3 IMPLANT
ELECT REM PT RETURN 9FT ADLT (ELECTROSURGICAL) ×3
ELECTRODE REM PT RTRN 9FT ADLT (ELECTROSURGICAL) ×1 IMPLANT
GLOVE BIO SURGEON STRL SZ 6 (GLOVE) ×3 IMPLANT
GLOVE BIO SURGEON STRL SZ 6.5 (GLOVE) ×2 IMPLANT
GLOVE BIO SURGEON STRL SZ7.5 (GLOVE) ×9 IMPLANT
GLOVE BIO SURGEONS STRL SZ 6.5 (GLOVE) ×1
GLOVE BIOGEL PI IND STRL 6.5 (GLOVE) ×2 IMPLANT
GLOVE BIOGEL PI IND STRL 7.5 (GLOVE) ×2 IMPLANT
GLOVE BIOGEL PI INDICATOR 6.5 (GLOVE) ×4
GLOVE BIOGEL PI INDICATOR 7.5 (GLOVE) ×4
GLOVE SURG SS PI 7.5 STRL IVOR (GLOVE) ×3 IMPLANT
GOWN STRL REUS W/ TWL LRG LVL3 (GOWN DISPOSABLE) ×2 IMPLANT
GOWN STRL REUS W/TWL 2XL LVL3 (GOWN DISPOSABLE) ×3 IMPLANT
GOWN STRL REUS W/TWL LRG LVL3 (GOWN DISPOSABLE) ×4
KIT BASIN OR (CUSTOM PROCEDURE TRAY) ×3 IMPLANT
KIT ROOM TURNOVER OR (KITS) ×3 IMPLANT
LIGASURE IMPACT 36 18CM CVD LR (INSTRUMENTS) IMPLANT
NS IRRIG 1000ML POUR BTL (IV SOLUTION) ×6 IMPLANT
PACK GENERAL/GYN (CUSTOM PROCEDURE TRAY) ×3 IMPLANT
PAD ABD 8X10 STRL (GAUZE/BANDAGES/DRESSINGS) ×3 IMPLANT
PAD ARMBOARD 7.5X6 YLW CONV (MISCELLANEOUS) ×3 IMPLANT
PENCIL BUTTON HOLSTER BLD 10FT (ELECTRODE) ×3 IMPLANT
SPECIMEN JAR LARGE (MISCELLANEOUS) ×3 IMPLANT
SPONGE GAUZE 4X4 12PLY STER LF (GAUZE/BANDAGES/DRESSINGS) ×3 IMPLANT
SPONGE LAP 18X18 X RAY DECT (DISPOSABLE) ×6 IMPLANT
STAPLER VISISTAT 35W (STAPLE) ×3 IMPLANT
SUCTION POOLE TIP (SUCTIONS) ×3 IMPLANT
SUT PDS II 0 TP-1 LOOPED 60 (SUTURE) ×6 IMPLANT
SUT SILK 2 0 SH CR/8 (SUTURE) ×3 IMPLANT
SUT VIC AB 2-0 CT1 27 (SUTURE) ×2
SUT VIC AB 2-0 CT1 TAPERPNT 27 (SUTURE) ×1 IMPLANT
SUT VIC AB 2-0 SH 18 (SUTURE) ×3 IMPLANT
SUT VIC AB 3-0 SH 18 (SUTURE) ×3 IMPLANT
SUT VICRYL 4-0 PS2 18IN ABS (SUTURE) IMPLANT
SUT VICRYL AB 2 0 TIES (SUTURE) ×3 IMPLANT
SUT VICRYL AB 3 0 TIES (SUTURE) ×3 IMPLANT
SWAB COLLECTION DEVICE MRSA (MISCELLANEOUS) ×3 IMPLANT
TOWEL OR 17X24 6PK STRL BLUE (TOWEL DISPOSABLE) ×3 IMPLANT
TOWEL OR 17X26 10 PK STRL BLUE (TOWEL DISPOSABLE) ×3 IMPLANT
TRAY FOLEY CATH 16FRSI W/METER (SET/KITS/TRAYS/PACK) IMPLANT
TRAY FOLEY W/METER SILVER 16FR (SET/KITS/TRAYS/PACK) ×3 IMPLANT
TUBE ANAEROBIC SPECIMEN COL (MISCELLANEOUS) ×3 IMPLANT
TUBE CONNECTING 12'X1/4 (SUCTIONS) ×2
TUBE CONNECTING 12X1/4 (SUCTIONS) ×4 IMPLANT
YANKAUER SUCT BULB TIP NO VENT (SUCTIONS) ×3 IMPLANT

## 2015-02-26 NOTE — Op Note (Signed)
PRE-OPERATIVE DIAGNOSIS: perforated viscus  POST-OPERATIVE DIAGNOSIS:  Same, perforated gastric ulcer  PROCEDURE:  Procedure(s): Exploratory laparotomy, biopsy and graham patch repair of perforated gastric ulcer.    SURGEON:  Surgeon(s): Almond Lint, MD  ANESTHESIA:   general  DRAINS: none   LOCAL MEDICATIONS USED:  NONE  SPECIMEN:  Source of Specimen:  abdominal fluid to microbiology, gastric ulcer wall to pathology  DISPOSITION OF SPECIMEN:  PATHOLOGY  COUNTS:  YES  DICTATION: .Dragon Dictation  PLAN OF CARE: Admit to inpatient   PATIENT DISPOSITION:  ICU - extubated and stable.  FINDINGS:  Significant volume of murky fluid throughout the abdomen. Thickened gastric antrum. Perforation in gastric antrum.  EBL: <50 mL, >500 mL purulent drainage in abdomen.  PROCEDURE:  Patient was identified in the holding area where he was taken to the operating room and placed on the operating room table in supine position.  General endotracheal anesthesia was induced. A Foley catheter was placed. His abdomen was prepped and draped in sterile fashion. Timeout was performed according to the surgical safety checklist.   When all was correct, we continued. A midline incision was made in the upper abdomen with a #10 blade. The cautery was used to divide the subcutaneous tissues. The fascia was entered sharply in the midline, and the fascial incision was carried out to the remainder of the skin incision with the cautery. Murky fluid was immediately obtained. This was suctioned. The abdomen was examined.   The distal stomach was found to be inflamed. The perforation was located. The stomach was mobilized adequately for visualization. Sutures were laid across the defect. This was gently reapproximated. The omentum was then laid on top of this and secured down with the tails of the suture.  Several additional sutures were placed on either side of the defect.    The abdomen was then copiously  irrigated with 12 L NS until clear.  The fascial incision was then closed with running #1 looped PDS sutures. The skin was irrigated and packed open with Kerlix.  The patient was taken to the ICU in stable condition. Needle, sponge, instrument counts were correct 2.

## 2015-02-26 NOTE — H&P (Signed)
Chad Wiggins is an 64 y.o. male.   Chief Complaint: abdominal pain HPI:  Pt is a 64 yo M who presents with 3 weeks of gradually worsening upper abdominal pain.  It was acutely worse over this evening and he sought care in the ED.  He had significant nausea/vomiting.  He complains of bloating, and sweating.  He denies f/c/d.  He has had some constipation.  He has been taking goody's powders for the last few weeks multiple times per day because of the belly pain.  He has never been diagnosed with ulcers or gastritis. The pain is worse with palpation, movement, or laying flat.    History reviewed. No pertinent past medical history.' History of fall.    Past Surgical History  Procedure Laterality Date  . Appendectomy      No family history on file. Social History:  reports that he has been smoking Cigarettes.  He has been smoking about 1.00 pack per day. He does not have any smokeless tobacco history on file. He reports that he drinks about 10.8 oz of alcohol per week. He reports that he does not use illicit drugs.  Allergies:  Allergies  Allergen Reactions  . Hydrocodone Nausea Only  . Oxycodone Nausea Only   Meds:   ALPRAZolam (XANAX) 0.5 MG tablet  Aspirin-Acetaminophen-Caffeine (GOODY HEADACHE PO)  ibuprofen (ADVIL,MOTRIN) 200 MG tablet  naproxen (NAPROSYN) 500 MG tablet         Results for orders placed or performed during the hospital encounter of 02/25/15 (from the past 48 hour(s))  CBC with Differential     Status: Abnormal   Collection Time: 02/25/15 11:01 PM  Result Value Ref Range   WBC 9.2 4.0 - 10.5 K/uL   RBC 5.63 4.22 - 5.81 MIL/uL   Hemoglobin 18.7 (H) 13.0 - 17.0 g/dL   HCT 53.7 (H) 39.0 - 52.0 %   MCV 95.4 78.0 - 100.0 fL   MCH 33.2 26.0 - 34.0 pg   MCHC 34.8 30.0 - 36.0 g/dL   RDW 13.2 11.5 - 15.5 %   Platelets 227 150 - 400 K/uL   Neutrophils Relative % 82 (H) 43 - 77 %   Neutro Abs 7.5 1.7 - 7.7 K/uL   Lymphocytes Relative 12 12 - 46 %   Lymphs Abs  1.1 0.7 - 4.0 K/uL   Monocytes Relative 6 3 - 12 %   Monocytes Absolute 0.5 0.1 - 1.0 K/uL   Eosinophils Relative 0 0 - 5 %   Eosinophils Absolute 0.0 0.0 - 0.7 K/uL   Basophils Relative 0 0 - 1 %   Basophils Absolute 0.0 0.0 - 0.1 K/uL  Comprehensive metabolic panel     Status: Abnormal   Collection Time: 02/25/15 11:01 PM  Result Value Ref Range   Sodium 141 135 - 145 mmol/L   Potassium 3.6 3.5 - 5.1 mmol/L   Chloride 105 101 - 111 mmol/L   CO2 21 (L) 22 - 32 mmol/L   Glucose, Bld 161 (H) 65 - 99 mg/dL   BUN 24 (H) 6 - 20 mg/dL   Creatinine, Ser 2.06 (H) 0.61 - 1.24 mg/dL   Calcium 10.4 (H) 8.9 - 10.3 mg/dL   Total Protein 8.5 (H) 6.5 - 8.1 g/dL   Albumin 4.6 3.5 - 5.0 g/dL   AST 36 15 - 41 U/L   ALT 40 17 - 63 U/L   Alkaline Phosphatase 117 38 - 126 U/L   Total Bilirubin 0.7 0.3 - 1.2  mg/dL   GFR calc non Af Amer 33 (L) >60 mL/min   GFR calc Af Amer 38 (L) >60 mL/min    Comment: (NOTE) The eGFR has been calculated using the CKD EPI equation. This calculation has not been validated in all clinical situations. eGFR's persistently <60 mL/min signify possible Chronic Kidney Disease.    Anion gap 15 5 - 15  Lipase, blood     Status: None   Collection Time: 02/25/15 11:01 PM  Result Value Ref Range   Lipase 32 22 - 51 U/L  Occult bld gastric/duodenum (cup to lab)     Status: None   Collection Time: 02/26/15 12:00 AM  Result Value Ref Range   pH, Gastric NOT DONE    Occult Blood, Gastric NEGATIVE NEGATIVE   Dg Abd Portable 1v  02/26/2015   CLINICAL DATA:  Enteric tube repositioning.  Initial encounter.  EXAM: PORTABLE ABDOMEN - 1 VIEW  COMPARISON:  Abdominal radiograph performed earlier today at 11:37 p.m.  FINDINGS: The patient's enteric tube is noted ending overlying the body of the stomach, though it extends somewhat more lateral than expected.  The visualized bowel gas pattern is grossly unremarkable. A small to moderate amount of stool is noted in the colon.  Crescentic  air is again noted under the left hemidiaphragm. A decubitus view would be helpful for further evaluation.  No acute osseous abnormalities are seen. Mild right basilar opacities likely reflect atelectasis.  IMPRESSION: 1. Enteric tube noted ending overlying the body of the stomach, though it extends somewhat more lateral than expected. 2. Crescentic air again noted underlying the left hemidiaphragm. A decubitus view would be helpful for further evaluation, to assess for free intra-abdominal air.  The findings were discussed at 12:48 a.m. on 02/26/2015 with Leslie, by Dr. Rosana Hoes.   Electronically Signed   By: Garald Balding M.D.   On: 02/26/2015 01:02   Dg Abd Portable 1v  02/26/2015   CLINICAL DATA:  NG tube placement.  EXAM: PORTABLE ABDOMEN - 1 VIEW  COMPARISON:  None.  FINDINGS: NG tube is coiled within the distal esophagus. Nonobstructed bowel gas pattern. Stool throughout the colon. Crescentic lucency under the left hemidiaphragm. Regional skeleton unremarkable.  IMPRESSION: Crescentic lucency under the left hemidiaphragm, potentially representing free intraperitoneal air. Recommend further evaluation with dedicated abdominal radiograph and decubitus views.  NG tube is coiled within the esophagus.  Recommend repositioning.  These results were called by telephone at the time of interpretation on 02/26/2015 at 12:48 am to Linda Hedges, RN, who verbally acknowledged these results.   Electronically Signed   By: Lovey Newcomer M.D.   On: 02/26/2015 00:49    Review of Systems  Constitutional: Negative.   HENT: Negative.   Eyes: Negative.   Respiratory: Negative.   Cardiovascular: Negative.   Gastrointestinal: Positive for nausea, vomiting and abdominal pain. Negative for diarrhea and constipation.  Genitourinary: Negative.   Musculoskeletal: Negative.   Skin: Negative.   Neurological: Negative.   Endo/Heme/Allergies: Negative.   Psychiatric/Behavioral: Negative.     Blood pressure 124/92,  pulse 115, temperature 97.5 F (36.4 C), temperature source Oral, resp. rate 18, SpO2 93 %. Physical Exam  Constitutional: He is oriented to person, place, and time. He appears well-developed and well-nourished. He appears distressed.  HENT:  Head: Normocephalic and atraumatic.  Eyes: Conjunctivae are normal. Pupils are equal, round, and reactive to light. No scleral icterus.  Neck: Normal range of motion.  Cardiovascular: Regular rhythm and intact distal pulses.  tachycardic  Respiratory: Effort normal and breath sounds normal. No respiratory distress. He exhibits no tenderness.  GI: Soft. He exhibits distension. There is tenderness. There is rebound and guarding.  Musculoskeletal: Normal range of motion.  Neurological: He is alert and oriented to person, place, and time.  Skin: Skin is warm. No rash noted. He is diaphoretic. No erythema. There is pallor.  Psychiatric: He has a normal mood and affect. His behavior is normal. Judgment and thought content normal.     Assessment/Plan Perforated viscus Acute renal insufficiency Hypovolemia SIRS  To OR for exploratory laparotomy. Probable ulcer given location of pain, history of EtOH and NSAIDS. Will need IV antibiotics NPO Admit to the ICU post op. Fluid resuscitation.  Discussed surgery with patient including possible findings and repairs.  Pt advised that if perforated large bowel is found, we may need to perform ostomy.    Reviewed risks of surgery including bleeding, infection, damage to adjacent structures, heart or lung complications, possible need for additional surgeries or procedures, difficulty with wound healing, possible hernia, possible prolonged hospitalization or recovery period, and death.  He understands and wishes to proceed.     Shefali Ng 02/26/2015, 2:29 AM

## 2015-02-26 NOTE — Transfer of Care (Signed)
Immediate Anesthesia Transfer of Care Note  Patient: Chad Wiggins  Procedure(s) Performed: Procedure(s): EXPLORATORY LAPAROTOMY WITH [facility] REPAIR OF GASTRIC ULCER (N/A)  Patient Location: PACU  Anesthesia Type:General  Level of Consciousness: awake, oriented, sedated, patient cooperative and responds to stimulation  Airway & Oxygen Therapy: Patient Spontanous Breathing and Patient connected to nasal cannula oxygen  Post-op Assessment: Report given to RN, Post -op Vital signs reviewed and stable and Patient moving all extremities X 4  Post vital signs: Reviewed and stable  Last Vitals:  Filed Vitals:   02/26/15 0245  BP: 134/99  Pulse: 123  Temp:   Resp: 22    Complications: No apparent anesthesia complications

## 2015-02-26 NOTE — Progress Notes (Signed)
ANTIBIOTIC CONSULT NOTE - INITIAL  Pharmacy Consult for Vancocin and Zosyn Indication: peritonitis  Allergies  Allergen Reactions  . Hydrocodone Nausea Only  . Oxycodone Nausea Only    Patient Measurements: Height: 6\' 5"  (195.6 cm) Weight: 215 lb (97.523 kg) IBW/kg (Calculated) : 89.1  Vital Signs: Temp: 97.9 F (36.6 C) (06/23 0545) Temp Source: Oral (06/22 2304) BP: 142/93 mmHg (06/23 0545) Pulse Rate: 88 (06/23 0545) Intake/Output from previous day: 06/22 0701 - 06/23 0700 In: 2075 [I.V.:2000] Out: 100 [Urine:100] Intake/Output from this shift: Total I/O In: 2075 [I.V.:2000; Other:75] Out: 100 [Urine:100]  Labs:  Recent Labs  02/25/15 2301  WBC 9.2  HGB 18.7*  PLT 227  CREATININE 2.06*   Estimated Creatinine Clearance: 47.5 mL/min (by C-G formula based on Cr of 2.06).  Medical History: History reviewed. No pertinent past medical history.  Medications:  Prescriptions prior to admission  Medication Sig Dispense Refill Last Dose  . ALPRAZolam (XANAX) 0.5 MG tablet Take 0.5 mg by mouth once.   12/17/2013 at Unknown time   Scheduled:  . fluconazole (DIFLUCAN) IV  200 mg Intravenous Q24H  . [START ON 02/27/2015] heparin  5,000 Units Subcutaneous 3 times per day  . HYDROmorphone      . HYDROmorphone PCA 0.3 mg/mL   Intravenous 6 times per day  . HYDROmorphone PCA 0.3 mg/mL      . pantoprazole (PROTONIX) IV  40 mg Intravenous QHS  . sodium chloride  1,000 mL Intravenous Once   Infusions:  . dextrose 5 % and 0.45% NaCl 125 mL/hr at 02/26/15 3016    Assessment: 63yo male c/o abdominal pain x2wk that worsened today, associated w/ N/V and constipation, noted has been taking Goody powders for the pain, now s/p ex-lap for perforation, distal stomach found to be inflamed, to begin IV ABX for peritonitis.  Goal of Therapy:  Vancomycin trough level 15-20 mcg/ml  Plan:  Rec'd Zosyn perioperatively; will start vancomycin 750mg  IV Q12H and Zosyn 3.375g IV Q8H and  monitor CBC, Cx, levels prn.  Vernard Gambles, PharmD, BCPS  02/26/2015,6:20 AM

## 2015-02-26 NOTE — ED Notes (Signed)
Dr.Yelverton made aware of critical xray results; free air noted in the abd xray per Dr.Davis.

## 2015-02-26 NOTE — ED Notes (Signed)
Consent signed at this time.

## 2015-02-26 NOTE — Anesthesia Procedure Notes (Signed)
Procedure Name: Intubation Date/Time: 02/26/2015 3:42 AM Performed by: Melina Schools Pre-anesthesia Checklist: Patient identified, Emergency Drugs available, Suction available, Patient being monitored and Timeout performed Patient Re-evaluated:Patient Re-evaluated prior to inductionOxygen Delivery Method: Circle system utilized Preoxygenation: Pre-oxygenation with 100% oxygen Intubation Type: IV induction, Rapid sequence and Cricoid Pressure applied Laryngoscope Size: Mac and 3 Grade View: Grade II Tube type: Oral Tube size: 8.0 mm Number of attempts: 1 Airway Equipment and Method: Stylet Placement Confirmation: ETT inserted through vocal cords under direct vision,  positive ETCO2 and breath sounds checked- equal and bilateral Secured at: 24 cm Tube secured with: Tape Dental Injury: Teeth and Oropharynx as per pre-operative assessment

## 2015-02-26 NOTE — Progress Notes (Signed)
Patient ID: Chad Wiggins, male   DOB: 11-18-1950, 64 y.o.   MRN: [id number]     [facility]      [address] [place], [place] [address]    Phone: [phone] FAX: [phone]     Subjective: To ICU around 6AM.  Sleepy, but arousable.  VSS.  sCr up.  UOP noted.  No flatus.  No n/v.  Little NGT output.   Objective:  Vital signs:  Filed Vitals:   02/26/15 0615 02/26/15 0700 02/26/15 0732 02/26/15 0736  BP: 133/94 114/90    Pulse: 93 89    Temp: 98.1 F (36.7 C)   98.1 F (36.7 C)  TempSrc: Oral   Oral  Resp: 19 17 15    Height:      Weight:      SpO2: 96% 96% 96%     Last BM Date:  (per pt report " a couple days ago")  Intake/Output   Yesterday:  06/22 0701 - 06/23 0700 In: 2310.4 [I.V.:2085.4; IV Piggyback:150] Out: 100 [Urine:100] This shift:    I/O last 3 completed shifts: In: 2310.4 [I.V.:2085.4; Other:75; IV Piggyback:150] Out: 100 [Urine:100]    Physical Exam: General: Pt awake/alert/oriented x4 in no acute distress Chest: cta.  No chest wall pain w good excursion CV:  Pulses intact.  Regular rhythm MS: Normal AROM mjr joints.  No obvious deformity Abdomen: no BS.  Abdomen is soft, distended.  midline dressing is dry.  No evidence of peritonitis.  No incarcerated hernias. Ext:  SCDs BLE.  No mjr edema.  No cyanosis Skin: No petechiae / purpura   Problem List:   Active Problems:   Perforated viscus    Results:   Labs: Results for orders placed or performed during the hospital encounter of 02/25/15 (from the past 48 hour(s))  CBC with Differential     Status: Abnormal   Collection Time: 02/25/15 11:01 PM  Result Value Ref Range   WBC 9.2 4.0 - 10.5 K/uL   RBC 5.63 4.22 - 5.81 MIL/uL   Hemoglobin 18.7 (H) 13.0 - 17.0 g/dL   HCT 53.7 (H) 39.0 - 52.0 %   MCV 95.4 78.0 - 100.0 fL   MCH 33.2 26.0 - 34.0 pg   MCHC 34.8 30.0 - 36.0 g/dL   RDW 13.2 11.5 - 15.5 %   Platelets 227 150 - 400 K/uL    Neutrophils Relative % 82 (H) 43 - 77 %   Neutro Abs 7.5 1.7 - 7.7 K/uL   Lymphocytes Relative 12 12 - 46 %   Lymphs Abs 1.1 0.7 - 4.0 K/uL   Monocytes Relative 6 3 - 12 %   Monocytes Absolute 0.5 0.1 - 1.0 K/uL   Eosinophils Relative 0 0 - 5 %   Eosinophils Absolute 0.0 0.0 - 0.7 K/uL   Basophils Relative 0 0 - 1 %   Basophils Absolute 0.0 0.0 - 0.1 K/uL  Comprehensive metabolic panel     Status: Abnormal   Collection Time: 02/25/15 11:01 PM  Result Value Ref Range   Sodium 141 135 - 145 mmol/L   Potassium 3.6 3.5 - 5.1 mmol/L   Chloride 105 101 - 111 mmol/L   CO2 21 (L) 22 - 32 mmol/L   Glucose, Bld 161 (H) 65 - 99 mg/dL   BUN 24 (H) 6 - 20 mg/dL   Creatinine, Ser 2.06 (H) 0.61 - 1.24 mg/dL   Calcium 10.4 (H) 8.9 - 10.3 mg/dL  Total Protein 8.5 (H) 6.5 - 8.1 g/dL   Albumin 4.6 3.5 - 5.0 g/dL   AST 36 15 - 41 U/L   ALT 40 17 - 63 U/L   Alkaline Phosphatase 117 38 - 126 U/L   Total Bilirubin 0.7 0.3 - 1.2 mg/dL   GFR calc non Af Amer 33 (L) >60 mL/min   GFR calc Af Amer 38 (L) >60 mL/min    Comment: (NOTE) The eGFR has been calculated using the CKD EPI equation. This calculation has not been validated in all clinical situations. eGFR's persistently <60 mL/min signify possible Chronic Kidney Disease.    Anion gap 15 5 - 15  Lipase, blood     Status: None   Collection Time: 02/25/15 11:01 PM  Result Value Ref Range   Lipase 32 22 - 51 U/L  Occult bld gastric/duodenum (cup to lab)     Status: None   Collection Time: 02/26/15 12:00 AM  Result Value Ref Range   pH, Gastric NOT DONE    Occult Blood, Gastric NEGATIVE NEGATIVE  Glucose, capillary     Status: Abnormal   Collection Time: 02/26/15  6:13 AM  Result Value Ref Range   Glucose-Capillary 147 (H) 65 - 99 mg/dL  CBC     Status: None   Collection Time: 02/26/15  7:00 AM  Result Value Ref Range   WBC 8.5 4.0 - 10.5 K/uL   RBC 4.95 4.22 - 5.81 MIL/uL   Hemoglobin 16.0 13.0 - 17.0 g/dL   HCT 47.9 39.0 - 52.0 %    MCV 96.8 78.0 - 100.0 fL   MCH 32.3 26.0 - 34.0 pg   MCHC 33.4 30.0 - 36.0 g/dL   RDW 13.6 11.5 - 15.5 %   Platelets 177 150 - 400 K/uL  Creatinine, serum     Status: Abnormal   Collection Time: 02/26/15  7:00 AM  Result Value Ref Range   Creatinine, Ser 2.10 (H) 0.61 - 1.24 mg/dL   GFR calc non Af Amer 32 (L) >60 mL/min   GFR calc Af Amer 37 (L) >60 mL/min    Comment: (NOTE) The eGFR has been calculated using the CKD EPI equation. This calculation has not been validated in all clinical situations. eGFR's persistently <60 mL/min signify possible Chronic Kidney Disease.     Imaging / Studies: Ct Abdomen Pelvis Wo Contrast  02/26/2015   CLINICAL DATA:  Subacute onset of generalized abdominal pain for 2 weeks. Nausea, vomiting and abdominal distention. Initial encounter.  EXAM: CT ABDOMEN AND PELVIS WITHOUT CONTRAST  TECHNIQUE: Multidetector CT imaging of the abdomen and pelvis was performed following the standard protocol without IV contrast.  COMPARISON:  None.  FINDINGS: Mild bibasilar atelectasis or scarring is seen.  Significant free intra-abdominal air is noted within the abdomen, with free fluid of borderline high attenuation tracking about the liver and in the pelvis. This is compatible with perforation. The site of perforation appears to be at the anterior aspect of the antrum of the stomach. An underlying ulceration cannot be excluded.  Scattered nonspecific hypodensities within the liver measure up to 1.6 cm in size. The spleen is unremarkable in appearance. The pancreas and adrenal glands are within normal limits. The gallbladder is grossly unremarkable.  The kidneys are unremarkable in appearance. There is no evidence of hydronephrosis. No renal or ureteral stones are seen. No perinephric stranding is appreciated.  No free fluid is identified. The small bowel is unremarkable in appearance. The stomach is within normal limits.  No acute vascular abnormalities are seen.  The appendix is  not definitely characterized; there is no evidence of appendicitis. The colon is unremarkable in appearance.  The bladder is mildly distended and grossly unremarkable. The prostate is normal in size. No inguinal lymphadenopathy is seen.  No acute osseous abnormalities are identified.  IMPRESSION: 1. Acute bowel perforation, with significant free intra-abdominal air and a small to moderate amount of free fluid about the liver and in the pelvis. This appears to arise from the anterior aspect of the antrum of the stomach. An underlying ulceration cannot be excluded. 2. Mild bibasilar atelectasis or scarring noted. 3. Nonspecific hypodensities within the liver, measuring up to 1.6 cm in size. The LFTs remain normal; dynamic liver protocol MRI or CT could be considered, on an elective nonemergent basis.  Critical Value/emergent results were called by telephone at the time of interpretation on 02/26/2015 at 2:42 am to Dr. Julianne Rice, who verbally acknowledged these results.   Electronically Signed   By: Garald Balding M.D.   On: 02/26/2015 02:50   Dg Abd Portable 1v  02/26/2015   CLINICAL DATA:  Enteric tube repositioning.  Initial encounter.  EXAM: PORTABLE ABDOMEN - 1 VIEW  COMPARISON:  Abdominal radiograph performed earlier today at 11:37 p.m.  FINDINGS: The patient's enteric tube is noted ending overlying the body of the stomach, though it extends somewhat more lateral than expected.  The visualized bowel gas pattern is grossly unremarkable. A small to moderate amount of stool is noted in the colon.  Crescentic air is again noted under the left hemidiaphragm. A decubitus view would be helpful for further evaluation.  No acute osseous abnormalities are seen. Mild right basilar opacities likely reflect atelectasis.  IMPRESSION: 1. Enteric tube noted ending overlying the body of the stomach, though it extends somewhat more lateral than expected. 2. Crescentic air again noted underlying the left hemidiaphragm. A  decubitus view would be helpful for further evaluation, to assess for free intra-abdominal air.  The findings were discussed at 12:48 a.m. on 02/26/2015 with [place], by Dr. Rosana Hoes.   Electronically Signed   By: Garald Balding M.D.   On: 02/26/2015 01:02   Dg Abd Portable 1v  02/26/2015   CLINICAL DATA:  NG tube placement.  EXAM: PORTABLE ABDOMEN - 1 VIEW  COMPARISON:  None.  FINDINGS: NG tube is coiled within the distal esophagus. Nonobstructed bowel gas pattern. Stool throughout the colon. Crescentic lucency under the left hemidiaphragm. Regional skeleton unremarkable.  IMPRESSION: Crescentic lucency under the left hemidiaphragm, potentially representing free intraperitoneal air. Recommend further evaluation with dedicated abdominal radiograph and decubitus views.  NG tube is coiled within the esophagus.  Recommend repositioning.  These results were called by telephone at the time of interpretation on 02/26/2015 at 12:48 am to Linda Hedges, RN, who verbally acknowledged these results.   Electronically Signed   By: Lovey Newcomer M.D.   On: 02/26/2015 00:49    Medications / Allergies:  Scheduled Meds: . antiseptic oral rinse  7 mL Mouth Rinse q12n4p  . chlorhexidine  15 mL Mouth Rinse BID  . fluconazole (DIFLUCAN) IV  200 mg Intravenous Q24H  . [START ON 02/27/2015] heparin  5,000 Units Subcutaneous 3 times per day  . HYDROmorphone      . HYDROmorphone PCA 0.3 mg/mL   Intravenous 6 times per day  . HYDROmorphone PCA 0.3 mg/mL      . pantoprazole (PROTONIX) IV  40 mg Intravenous QHS  . piperacillin-tazobactam (ZOSYN)  IV  3.375 g Intravenous Q8H  . sodium chloride  1,000 mL Intravenous Once  . sodium chloride  500 mL Intravenous Once  . vancomycin  750 mg Intravenous Once  . vancomycin  750 mg Intravenous Q12H   Continuous Infusions: . dextrose 5 % and 0.45% NaCl 125 mL/hr at 02/26/15 0619   PRN Meds:.diphenhydrAMINE **OR** diphenhydrAMINE, HYDROmorphone (DILAUDID) injection, naloxone  **AND** sodium chloride, ondansetron (ZOFRAN) IV, ondansetron **OR** ondansetron (ZOFRAN) IV, promethazine  Antibiotics: Anti-infectives    Start     Dose/Rate Route Frequency Ordered Stop   02/26/15 1800  vancomycin (VANCOCIN) IVPB 750 mg/150 ml premix     750 mg 150 mL/hr over 60 Minutes Intravenous Every 12 hours 02/26/15 0626     02/26/15 0800  piperacillin-tazobactam (ZOSYN) IVPB 3.375 g     3.375 g 12.5 mL/hr over 240 Minutes Intravenous Every 8 hours 02/26/15 0626     02/26/15 0630  fluconazole (DIFLUCAN) IVPB 200 mg     200 mg 100 mL/hr over 60 Minutes Intravenous Every 24 hours 02/26/15 0618     02/26/15 0630  vancomycin (VANCOCIN) IVPB 750 mg/150 ml premix     750 mg 150 mL/hr over 60 Minutes Intravenous  Once 02/26/15 0626     02/26/15 0130  piperacillin-tazobactam (ZOSYN) IVPB 3.375 g     3.375 g 100 mL/hr over 30 Minutes Intravenous  Once 02/26/15 0115 02/26/15 0248        Assessment/Plan POD#0 exploratory laparotomy biopsy and graham patch repair of perforated gastric ulcer---Dr. Barry Dienes  -continue NGT to LIWS, NPO, likely will need to be studied in 3-4 days  -continue PCA for pain control -PPI  -await biopsy results VTE prophylaxis-SCD/heparin ID-fluconazole, zosyn and Vanc D#1/7.  Is Vanc needed? FEN-NPO, IVF, give 500cc fluid bolus AKI-baseline unknown, check UA.  Give fluid bolus.  Monitor I/Os and BMP in AM.  DC Vanc EtOH use-does not use daily, does not need CIWA Dispo-transfer to floor  Erby Pian, [facility] Pager [phone](7A-4:30P) For consults and floor pages call [phone](7A-4:30P)  02/26/2015 7:48 AM

## 2015-02-26 NOTE — ED Notes (Signed)
NG tube removed per MD; Dr.Yelverton at bedside speaking with patient; patient transported to CT

## 2015-02-26 NOTE — ED Notes (Signed)
Dr. Byerly at bedside.  

## 2015-02-26 NOTE — Anesthesia Preprocedure Evaluation (Addendum)
Anesthesia Evaluation  Patient identified by MRN, date of birth, ID band Patient awake    Reviewed: Allergy & Precautions, NPO status , Patient's Chart, lab work & pertinent test results  Airway Mallampati: II  TM Distance: >3 FB Neck ROM: Full    Dental   Pulmonary COPDCurrent Smoker,  breath sounds clear to auscultation        Cardiovascular negative cardio ROS  Rhythm:Regular Rate:Normal     Neuro/Psych negative neurological ROS     GI/Hepatic Neg liver ROS, Gastric ulcer   Endo/Other  negative endocrine ROS  Renal/GU Renal InsufficiencyRenal disease     Musculoskeletal   Abdominal   Peds  Hematology negative hematology ROS (+)   Anesthesia Other Findings   Reproductive/Obstetrics                            Anesthesia Physical Anesthesia Plan  ASA: III and emergent  Anesthesia Plan: General   Post-op Pain Management:    Induction: Intravenous, Rapid sequence and Cricoid pressure planned  Airway Management Planned: Oral ETT  Additional Equipment:   Intra-op Plan:   Post-operative Plan: Extubation in OR  Informed Consent: I have reviewed the patients History and Physical, chart, labs and discussed the procedure including the risks, benefits and alternatives for the proposed anesthesia with the patient or authorized representative who has indicated his/her understanding and acceptance.   Dental advisory given  Plan Discussed with:   Anesthesia Plan Comments:         Anesthesia Quick Evaluation

## 2015-02-26 NOTE — Progress Notes (Signed)
eLink Physician-Brief Progress Note Patient Name: Chad Wiggins DOB: 06/25/1951 MRN: [id number]  Date of Service  02/26/2015   HPI/Events of Note    ICD-9-CM ICD-10-CM   1. Bowel perforation 569.83 K63.1   2. Abdominal pain 789.00 R10.9 DG Abd Portable 1V     DG Abd Portable 1V     CT Abdomen Pelvis Wo Contrast     CT Abdomen Pelvis Wo Contrast     CANCELED: CT Abdomen Pelvis W Contrast     CANCELED: CT Abdomen Pelvis W Contrast     CANCELED: DG Chest Port 1 View     CANCELED: DG Chest Port 1 View      eICU Interventions  Camera exam - looks well Vitals stable  PULMONARY No results for input(s): PHART, PCO2ART, PO2ART, HCO3, TCO2, O2SAT in the last 168 hours.  Invalid input(s): PCO2, PO2  CBC  Recent Labs Lab 02/25/15 2301  HGB 18.7*  HCT 53.7*  WBC 9.2  PLT 227    COAGULATION No results for input(s): INR in the last 168 hours.  CARDIAC  No results for input(s): TROPONINI in the last 168 hours. No results for input(s): PROBNP in the last 168 hours.   CHEMISTRY  Recent Labs Lab 02/25/15 2301  NA 141  K 3.6  CL 105  CO2 21*  GLUCOSE 161*  BUN 24*  CREATININE 2.06*  CALCIUM 10.4*   Estimated Creatinine Clearance: 47.5 mL/min (by C-G formula based on Cr of 2.06).   LIVER  Recent Labs Lab 02/25/15 2301  AST 36  ALT 40  ALKPHOS 117  BILITOT 0.7  PROT 8.5*  ALBUMIN 4.6     INFECTIOUS No results for input(s): LATICACIDVEN, PROCALCITON in the last 168 hours.   ENDOCRINE CBG (last 3)   Recent Labs  02/26/15 0613  GLUCAP 147*         IMAGING x48h  - image(s) personally visualized  -   highlighted in bold Ct Abdomen Pelvis Wo Contrast  02/26/2015   CLINICAL DATA:  Subacute onset of generalized abdominal pain for 2 weeks. Nausea, vomiting and abdominal distention. Initial encounter.  EXAM: CT ABDOMEN AND PELVIS WITHOUT CONTRAST  TECHNIQUE: Multidetector CT imaging of the abdomen and pelvis was performed following the standard  protocol without IV contrast.  COMPARISON:  None.  FINDINGS: Mild bibasilar atelectasis or scarring is seen.  Significant free intra-abdominal air is noted within the abdomen, with free fluid of borderline high attenuation tracking about the liver and in the pelvis. This is compatible with perforation. The site of perforation appears to be at the anterior aspect of the antrum of the stomach. An underlying ulceration cannot be excluded.  Scattered nonspecific hypodensities within the liver measure up to 1.6 cm in size. The spleen is unremarkable in appearance. The pancreas and adrenal glands are within normal limits. The gallbladder is grossly unremarkable.  The kidneys are unremarkable in appearance. There is no evidence of hydronephrosis. No renal or ureteral stones are seen. No perinephric stranding is appreciated.  No free fluid is identified. The small bowel is unremarkable in appearance. The stomach is within normal limits. No acute vascular abnormalities are seen.  The appendix is not definitely characterized; there is no evidence of appendicitis. The colon is unremarkable in appearance.  The bladder is mildly distended and grossly unremarkable. The prostate is normal in size. No inguinal lymphadenopathy is seen.  No acute osseous abnormalities are identified.  IMPRESSION: 1. Acute bowel perforation, with significant free intra-abdominal air and  a small to moderate amount of free fluid about the liver and in the pelvis. This appears to arise from the anterior aspect of the antrum of the stomach. An underlying ulceration cannot be excluded. 2. Mild bibasilar atelectasis or scarring noted. 3. Nonspecific hypodensities within the liver, measuring up to 1.6 cm in size. The LFTs remain normal; dynamic liver protocol MRI or CT could be considered, on an elective nonemergent basis.  Critical Value/emergent results were called by telephone at the time of interpretation on 02/26/2015 at 2:42 am to Dr. Loren Racer,  who verbally acknowledged these results.   Electronically Signed   By: Roanna Raider M.D.   On: 02/26/2015 02:50   Dg Abd Portable 1v  02/26/2015   CLINICAL DATA:  Enteric tube repositioning.  Initial encounter.  EXAM: PORTABLE ABDOMEN - 1 VIEW  COMPARISON:  Abdominal radiograph performed earlier today at 11:37 p.m.  FINDINGS: The patient's enteric tube is noted ending overlying the body of the stomach, though it extends somewhat more lateral than expected.  The visualized bowel gas pattern is grossly unremarkable. A small to moderate amount of stool is noted in the colon.  Crescentic air is again noted under the left hemidiaphragm. A decubitus view would be helpful for further evaluation.  No acute osseous abnormalities are seen. Mild right basilar opacities likely reflect atelectasis.  IMPRESSION: 1. Enteric tube noted ending overlying the body of the stomach, though it extends somewhat more lateral than expected. 2. Crescentic air again noted underlying the left hemidiaphragm. A decubitus view would be helpful for further evaluation, to assess for free intra-abdominal air.  The findings were discussed at 12:48 a.m. on 02/26/2015 with [name] RN, by Dr. Earlene Plater.   Electronically Signed   By: Roanna Raider M.D.   On: 02/26/2015 01:02   Dg Abd Portable 1v  02/26/2015   CLINICAL DATA:  NG tube placement.  EXAM: PORTABLE ABDOMEN - 1 VIEW  COMPARISON:  None.  FINDINGS: NG tube is coiled within the distal esophagus. Nonobstructed bowel gas pattern. Stool throughout the colon. Crescentic lucency under the left hemidiaphragm. Regional skeleton unremarkable.  IMPRESSION: Crescentic lucency under the left hemidiaphragm, potentially representing free intraperitoneal air. Recommend further evaluation with dedicated abdominal radiograph and decubitus views.  NG tube is coiled within the esophagus.  Recommend repositioning.  These results were called by telephone at the time of interpretation on 02/26/2015 at 12:48  am to [name], RN, who verbally acknowledged these results.   Electronically Signed   By: Annia Belt M.D.   On: 02/26/2015 00:49        Fluids per CCS Monitor AKI No eMD interventions      Malyn Aytes 02/26/2015, 7:01 AM

## 2015-02-27 ENCOUNTER — Encounter (HOSPITAL_COMMUNITY): Payer: Self-pay | Admitting: General Surgery

## 2015-02-27 LAB — CBC
HCT: 44.2 % (ref 39.0–52.0)
Hemoglobin: 14.5 g/dL (ref 13.0–17.0)
MCH: 32.1 pg (ref 26.0–34.0)
MCHC: 32.8 g/dL (ref 30.0–36.0)
MCV: 97.8 fL (ref 78.0–100.0)
Platelets: 183 10*3/uL (ref 150–400)
RBC: 4.52 MIL/uL (ref 4.22–5.81)
RDW: 13.8 % (ref 11.5–15.5)
WBC: 10.1 10*3/uL (ref 4.0–10.5)

## 2015-02-27 LAB — BASIC METABOLIC PANEL
Anion gap: 10 (ref 5–15)
BUN: 34 mg/dL — ABNORMAL HIGH (ref 6–20)
CO2: 26 mmol/L (ref 22–32)
CREATININE: 1.49 mg/dL — AB (ref 0.61–1.24)
Calcium: 8.7 mg/dL — ABNORMAL LOW (ref 8.9–10.3)
Chloride: 105 mmol/L (ref 101–111)
GFR calc non Af Amer: 49 mL/min — ABNORMAL LOW (ref >60)
GFR, EST AFRICAN AMERICAN: 57 mL/min — AB (ref >60)
Glucose, Bld: 148 mg/dL — ABNORMAL HIGH (ref 65–99)
Potassium: 5.2 mmol/L — ABNORMAL HIGH (ref 3.5–5.1)
Sodium: 141 mmol/L (ref 135–145)

## 2015-02-27 MED ORDER — HYDROMORPHONE HCL 1 MG/ML IJ SOLN
1.0000 mg | INTRAMUSCULAR | Status: DC | PRN
  Administered 2015-03-02 – 2015-03-03 (×3): 2 mg via INTRAVENOUS
  Filled 2015-02-27 (×3): qty 2

## 2015-02-27 NOTE — Progress Notes (Signed)
[facility] Progress Note  1 Day Post-Op  Subjective: Pt doing okay, feels tethered.  Wants to get up and move.  Wants ice chips.  No N/V, abdominal pain well controlled.  No flatus or BM yet.  Pain well controlled.  Objective: Vital signs in last 24 hours: Temp:  [98.2 F (36.8 C)-98.8 F (37.1 C)] 98.5 F (36.9 C) (06/24 0913) Pulse Rate:  [76-86] 79 (06/24 0913) Resp:  [9-27] 26 (06/24 0913) BP: (99-130)/(72-90) 126/88 mmHg (06/24 0913) SpO2:  [91 %-97 %] 93 % (06/24 0913) Last BM Date: 02/23/15  Intake/Output from previous day: 06/23 0701 - 06/24 0700 In: 2353.4 [I.V.:1540.9; IV Piggyback:812.5] Out: 550 [Urine:500; Emesis/NG output:50] Intake/Output this shift:    PE: Gen:  Alert, NAD, pleasant Abd: Soft, distended, tender to palpation, +BS, no HSM, midline wound clean  Lab Results:   Recent Labs  02/26/15 0700 02/27/15 0313  WBC 8.5 10.1  HGB 16.0 14.5  HCT 47.9 44.2  PLT 177 183   BMET  Recent Labs  02/25/15 2301 02/26/15 0700 02/27/15 0313  NA 141  --  141  K 3.6  --  5.2*  CL 105  --  105  CO2 21*  --  26  GLUCOSE 161*  --  148*  BUN 24*  --  34*  CREATININE 2.06* 2.10* 1.49*  CALCIUM 10.4*  --  8.7*   PT/INR No results for input(s): LABPROT, INR in the last 72 hours. CMP     Component Value Date/Time   NA 141 02/27/2015 0313   K 5.2* 02/27/2015 0313   CL 105 02/27/2015 0313   CO2 26 02/27/2015 0313   GLUCOSE 148* 02/27/2015 0313   BUN 34* 02/27/2015 0313   CREATININE 1.49* 02/27/2015 0313   CALCIUM 8.7* 02/27/2015 0313   PROT 8.5* 02/25/2015 2301   ALBUMIN 4.6 02/25/2015 2301   AST 36 02/25/2015 2301   ALT 40 02/25/2015 2301   ALKPHOS 117 02/25/2015 2301   BILITOT 0.7 02/25/2015 2301   GFRNONAA 49* 02/27/2015 0313   GFRAA 57* 02/27/2015 0313   Lipase     Component Value Date/Time   LIPASE 32 02/25/2015 2301       Studies/Results: Ct Abdomen Pelvis Wo Contrast  02/26/2015   CLINICAL DATA:  Subacute onset  of generalized abdominal pain for 2 weeks. Nausea, vomiting and abdominal distention. Initial encounter.  EXAM: CT ABDOMEN AND PELVIS WITHOUT CONTRAST  TECHNIQUE: Multidetector CT imaging of the abdomen and pelvis was performed following the standard protocol without IV contrast.  COMPARISON:  None.  FINDINGS: Mild bibasilar atelectasis or scarring is seen.  Significant free intra-abdominal air is noted within the abdomen, with free fluid of borderline high attenuation tracking about the liver and in the pelvis. This is compatible with perforation. The site of perforation appears to be at the anterior aspect of the antrum of the stomach. An underlying ulceration cannot be excluded.  Scattered nonspecific hypodensities within the liver measure up to 1.6 cm in size. The spleen is unremarkable in appearance. The pancreas and adrenal glands are within normal limits. The gallbladder is grossly unremarkable.  The kidneys are unremarkable in appearance. There is no evidence of hydronephrosis. No renal or ureteral stones are seen. No perinephric stranding is appreciated.  No free fluid is identified. The small bowel is unremarkable in appearance. The stomach is within normal limits. No acute vascular abnormalities are seen.  The appendix is not definitely characterized; there is no evidence of appendicitis. The colon  is unremarkable in appearance.  The bladder is mildly distended and grossly unremarkable. The prostate is normal in size. No inguinal lymphadenopathy is seen.  No acute osseous abnormalities are identified.  IMPRESSION: 1. Acute bowel perforation, with significant free intra-abdominal air and a small to moderate amount of free fluid about the liver and in the pelvis. This appears to arise from the anterior aspect of the antrum of the stomach. An underlying ulceration cannot be excluded. 2. Mild bibasilar atelectasis or scarring noted. 3. Nonspecific hypodensities within the liver, measuring up to 1.6 cm in  size. The LFTs remain normal; dynamic liver protocol MRI or CT could be considered, on an elective nonemergent basis.  Critical Value/emergent results were called by telephone at the time of interpretation on 02/26/2015 at 2:42 am to Dr. Loren Racer, who verbally acknowledged these results.   Electronically Signed   By: Roanna Raider M.D.   On: 02/26/2015 02:50   Dg Abd Portable 1v  02/26/2015   CLINICAL DATA:  Enteric tube repositioning.  Initial encounter.  EXAM: PORTABLE ABDOMEN - 1 VIEW  COMPARISON:  Abdominal radiograph performed earlier today at 11:37 p.m.  FINDINGS: The patient's enteric tube is noted ending overlying the body of the stomach, though it extends somewhat more lateral than expected.  The visualized bowel gas pattern is grossly unremarkable. A small to moderate amount of stool is noted in the colon.  Crescentic air is again noted under the left hemidiaphragm. A decubitus view would be helpful for further evaluation.  No acute osseous abnormalities are seen. Mild right basilar opacities likely reflect atelectasis.  IMPRESSION: 1. Enteric tube noted ending overlying the body of the stomach, though it extends somewhat more lateral than expected. 2. Crescentic air again noted underlying the left hemidiaphragm. A decubitus view would be helpful for further evaluation, to assess for free intra-abdominal air.  The findings were discussed at 12:48 a.m. on 02/26/2015 with [name] RN, by Dr. Earlene Plater.   Electronically Signed   By: Roanna Raider M.D.   On: 02/26/2015 01:02   Dg Abd Portable 1v  02/26/2015   CLINICAL DATA:  NG tube placement.  EXAM: PORTABLE ABDOMEN - 1 VIEW  COMPARISON:  None.  FINDINGS: NG tube is coiled within the distal esophagus. Nonobstructed bowel gas pattern. Stool throughout the colon. Crescentic lucency under the left hemidiaphragm. Regional skeleton unremarkable.  IMPRESSION: Crescentic lucency under the left hemidiaphragm, potentially representing free  intraperitoneal air. Recommend further evaluation with dedicated abdominal radiograph and decubitus views.  NG tube is coiled within the esophagus.  Recommend repositioning.  These results were called by telephone at the time of interpretation on 02/26/2015 at 12:48 am to [name], RN, who verbally acknowledged these results.   Electronically Signed   By: Annia Belt M.D.   On: 02/26/2015 00:49    Anti-infectives: Anti-infectives    Start     Dose/Rate Route Frequency Ordered Stop   02/26/15 1800  vancomycin (VANCOCIN) IVPB 750 mg/150 ml premix  Status:  Discontinued     750 mg 150 mL/hr over 60 Minutes Intravenous Every 12 hours 02/26/15 0626 02/26/15 0746   02/26/15 0800  piperacillin-tazobactam (ZOSYN) IVPB 3.375 g     3.375 g 12.5 mL/hr over 240 Minutes Intravenous Every 8 hours 02/26/15 0626     02/26/15 0630  fluconazole (DIFLUCAN) IVPB 200 mg     200 mg 100 mL/hr over 60 Minutes Intravenous Every 24 hours 02/26/15 0618     02/26/15 0630  vancomycin (  VANCOCIN) IVPB 750 mg/150 ml premix  Status:  Discontinued     750 mg 150 mL/hr over 60 Minutes Intravenous  Once 02/26/15 0626 02/26/15 0746   02/26/15 0130  piperacillin-tazobactam (ZOSYN) IVPB 3.375 g     3.375 g 100 mL/hr over 30 Minutes Intravenous  Once 02/26/15 0115 02/26/15 0248       Assessment/Plan Perforated gastric ulcer POD #1 exploratory laparotomy biopsy and graham patch repair---Dr. Donell Beers  -Continue NGT to LIWS, NPO, likely will need to be studied in 2-3 days when bowel function has resumed -D/C PCA later today and start IV push -PPI  -Ambulate and IS -Stomach, biopsy, Gastric - FIBRINOPURULENT MATERIAL, CONSISTENT WITH ULCER BED. - THERE IS NO EVIDENCE OF DYSPLASIA OR MALIGNANCY.  VTE prophylaxis-SCD/heparin ID-fluconazole, zosyn D#2/7, d/c vanc FEN-NPO, IVF Hyperkalemia - 5.2 today, IVF, monitor closely AKI-baseline unknown, UA okay, Cr. Down to 1.49 today. Monitor I/Os and BMP in AM. EtOH use-does  not use daily, does not need CIWA Dispo-On floor    LOS: 1 day    Nonie Hoyer 02/27/2015, 9:25 AM Pager: [phone]

## 2015-02-27 NOTE — Anesthesia Postprocedure Evaluation (Signed)
  Anesthesia Post-op Note  Patient: Chad Wiggins  Procedure(s) Performed: Procedure(s): EXPLORATORY LAPAROTOMY WITH [facility] REPAIR OF GASTRIC ULCER (N/A)  Patient Location: PACU  Anesthesia Type:General  Level of Consciousness: awake, alert  and oriented  Airway and Oxygen Therapy: Patient Spontanous Breathing  Post-op Pain: mild  Post-op Assessment: Post-op Vital signs reviewed              Post-op Vital Signs: Reviewed  Last Vitals:  Filed Vitals:   02/27/15 0913  BP: 126/88  Pulse: 79  Temp: 36.9 C  Resp: 26    Complications: No apparent anesthesia complications

## 2015-02-28 LAB — BASIC METABOLIC PANEL
Anion gap: 9 (ref 5–15)
BUN: 20 mg/dL (ref 6–20)
CHLORIDE: 102 mmol/L (ref 101–111)
CO2: 28 mmol/L (ref 22–32)
CREATININE: 1.05 mg/dL (ref 0.61–1.24)
Calcium: 8.8 mg/dL — ABNORMAL LOW (ref 8.9–10.3)
GFR calc Af Amer: >60 mL/min (ref >60)
GFR calc non Af Amer: >60 mL/min (ref >60)
GLUCOSE: 118 mg/dL — AB (ref 65–99)
POTASSIUM: 4.1 mmol/L (ref 3.5–5.1)
SODIUM: 139 mmol/L (ref 135–145)

## 2015-02-28 LAB — CBC
HEMATOCRIT: 42 % (ref 39.0–52.0)
Hemoglobin: 13.7 g/dL (ref 13.0–17.0)
MCH: 32 pg (ref 26.0–34.0)
MCHC: 32.6 g/dL (ref 30.0–36.0)
MCV: 98.1 fL (ref 78.0–100.0)
Platelets: 186 10*3/uL (ref 150–400)
RBC: 4.28 MIL/uL (ref 4.22–5.81)
RDW: 13.7 % (ref 11.5–15.5)
WBC: 10.8 10*3/uL — AB (ref 4.0–10.5)

## 2015-02-28 MED ORDER — LACTATED RINGERS IV BOLUS (SEPSIS): 1000.0000 mL | Freq: Three times a day (TID) | INTRAVENOUS | Status: AC | PRN

## 2015-02-28 MED ORDER — BISACODYL 10 MG RE SUPP: 10.0000 mg | Freq: Two times a day (BID) | RECTAL | Status: DC | PRN

## 2015-02-28 MED ORDER — SODIUM CHLORIDE 0.9 % IV SOLN
80.0000 mg | Freq: Two times a day (BID) | INTRAVENOUS | Status: DC
  Administered 2015-02-28 – 2015-03-02 (×6): 80 mg via INTRAVENOUS
  Filled 2015-02-28 (×13): qty 80

## 2015-02-28 MED ORDER — BLISTEX MEDICATED EX OINT
1.0000 "application " | TOPICAL_OINTMENT | Freq: Two times a day (BID) | CUTANEOUS | Status: DC
  Administered 2015-02-28 – 2015-03-03 (×7): 1 via TOPICAL
  Filled 2015-02-28: qty 10

## 2015-02-28 MED ORDER — MAGIC MOUTHWASH: 15.0000 mL | Freq: Four times a day (QID) | ORAL | Status: DC | PRN

## 2015-02-28 MED ORDER — MENTHOL 3 MG MT LOZG: 1.0000 | LOZENGE | OROMUCOSAL | Status: DC | PRN

## 2015-02-28 MED ORDER — ALUM & MAG HYDROXIDE-SIMETH 200-200-20 MG/5ML PO SUSP: 30.0000 mL | Freq: Four times a day (QID) | ORAL | Status: DC | PRN

## 2015-02-28 MED ORDER — PHENOL 1.4 % MT LIQD
2.0000 | OROMUCOSAL | Status: DC | PRN
Start: 2015-02-28 — End: 2015-03-04

## 2015-02-28 NOTE — Progress Notes (Signed)
Sent text to Dr. Carolynne Edouard that lab had called earlier.  Pt grew few gm + cocci and rare yeast in his peritoneal fluid.  Dr. Carolynne Edouard called me back and informed of above.

## 2015-02-28 NOTE — Progress Notes (Signed)
[facility]  [address] [place], [place] [address] Phone: [phone] FAX: [place] [phone] 04-03-1951   Problem List:   Active Problems:   Perforated viscus   2 Days Post-Op  02/26/2015  POST-OPERATIVE DIAGNOSIS: Same, perforated gastric ulcer  PROCEDURE: Procedure(s): Exploratory laparotomy, biopsy and graham patch repair of perforated gastric ulcer.   SURGEON: Surgeon(s): Stark Klein, MD  -Stomach, biopsy, Gastric - FIBRINOPURULENT MATERIAL, CONSISTENT WITH ULCER BED. - THERE IS NO EVIDENCE OF DYSPLASIA OR MALIGNANCY.  Assessment  Recovering  Plan:  Perforated gastric ulcer POD #2 exploratory laparotomy biopsy and graham patch repair---Dr. Barry Dienes  -Continue NGT to LIWS, NPO, likely will need to be studied in 2-3 days when bowel function has resumed -D/C PCA tomorrow -PPI q12H -Ambulate and IS  VTE prophylaxis-SCD/heparin ID-fluconazole, zosyn D#2/7, d/c vanc FEN-NPO, IVF Hyperkalemia - 4.1 today, IVF, monitor closely AKI-baseline unknown, UA okay, Cr. Down to 1.05 today. Monitor I/Os and BMP in AM. EtOH use-does not use daily, does not need CIWA Dispo-On floor  Adin Hector, M.D., F.A.C.S. Gastrointestinal and Minimally Invasive Surgery [facility] [address] [place], [place] [address] [phone] Main / Paging   02/28/2015  Subjective:  Walking Likes PCA   Objective:  Vital signs:  Filed Vitals:   02/28/15 0135 02/28/15 0403 02/28/15 0500 02/28/15 0800  BP: 113/77  115/64   Pulse: 75  73   Temp: 98 F (36.7 C)  98.3 F (36.8 C)   TempSrc: Oral  Oral   Resp: 22 23 18 14   Height:      Weight:      SpO2: 95% 94% 97% 93%    Last BM Date: 02/23/15  Intake/Output   Yesterday:  06/24 0701 - 06/25 0700 In: 0086 [I.V.:3075; IV Piggyback:200] Out: 1000 [Urine:850; Emesis/NG output:150] This shift:     Bowel  function:  Flatus: n  BM: n  Drain: clear colorless.  Flushes fine  Physical Exam:  General: Pt awake/alert/oriented x4 in no acute distress Eyes: PERRL, normal EOM.  Sclera clear.  No icterus Neuro: CN II-XII intact w/o focal sensory/motor deficits. Lymph: No head/neck/groin lymphadenopathy Psych:  No delerium/psychosis/paranoia HENT: Normocephalic, Mucus membranes moist.  No thrush Neck: Supple, No tracheal deviation Chest: No chest wall pain w good excursion CV:  Pulses intact.  Regular rhythm MS: Normal AROM mjr joints.  No obvious deformity Abdomen: Soft.  Nondistended.  Mildly tender at incisions only.  No evidence of peritonitis.  No incarcerated hernias. Ext:  SCDs BLE.  No mjr edema.  No cyanosis Skin: No petechiae / purpura  Results:   Labs: Results for orders placed or performed during the hospital encounter of 02/25/15 (from the past 48 hour(s))  CBC     Status: None   Collection Time: 02/27/15  3:13 AM  Result Value Ref Range   WBC 10.1 4.0 - 10.5 K/uL   RBC 4.52 4.22 - 5.81 MIL/uL   Hemoglobin 14.5 13.0 - 17.0 g/dL   HCT 44.2 39.0 - 52.0 %   MCV 97.8 78.0 - 100.0 fL   MCH 32.1 26.0 - 34.0 pg   MCHC 32.8 30.0 - 36.0 g/dL   RDW 13.8 11.5 - 15.5 %   Platelets 183 150 - 400 K/uL  Basic metabolic panel     Status: Abnormal   Collection Time: 02/27/15  3:13 AM  Result Value Ref Range   Sodium 141 135 - 145  mmol/L   Potassium 5.2 (H) 3.5 - 5.1 mmol/L    Comment: DELTA CHECK NOTED   Chloride 105 101 - 111 mmol/L   CO2 26 22 - 32 mmol/L   Glucose, Bld 148 (H) 65 - 99 mg/dL   BUN 34 (H) 6 - 20 mg/dL   Creatinine, Ser 1.49 (H) 0.61 - 1.24 mg/dL   Calcium 8.7 (L) 8.9 - 10.3 mg/dL   GFR calc non Af Amer 49 (L) >60 mL/min   GFR calc Af Amer 57 (L) >60 mL/min    Comment: (NOTE) The eGFR has been calculated using the CKD EPI equation. This calculation has not been validated in all clinical situations. eGFR's persistently <60 mL/min signify possible Chronic  Kidney Disease.    Anion gap 10 5 - 15  CBC     Status: Abnormal   Collection Time: 02/28/15  3:35 AM  Result Value Ref Range   WBC 10.8 (H) 4.0 - 10.5 K/uL   RBC 4.28 4.22 - 5.81 MIL/uL   Hemoglobin 13.7 13.0 - 17.0 g/dL   HCT 42.0 39.0 - 52.0 %   MCV 98.1 78.0 - 100.0 fL   MCH 32.0 26.0 - 34.0 pg   MCHC 32.6 30.0 - 36.0 g/dL   RDW 13.7 11.5 - 15.5 %   Platelets 186 150 - 400 K/uL  Basic metabolic panel     Status: Abnormal   Collection Time: 02/28/15  3:35 AM  Result Value Ref Range   Sodium 139 135 - 145 mmol/L   Potassium 4.1 3.5 - 5.1 mmol/L    Comment: DELTA CHECK NOTED   Chloride 102 101 - 111 mmol/L   CO2 28 22 - 32 mmol/L   Glucose, Bld 118 (H) 65 - 99 mg/dL   BUN 20 6 - 20 mg/dL   Creatinine, Ser 1.05 0.61 - 1.24 mg/dL   Calcium 8.8 (L) 8.9 - 10.3 mg/dL   GFR calc non Af Amer >60 >60 mL/min   GFR calc Af Amer >60 >60 mL/min    Comment: (NOTE) The eGFR has been calculated using the CKD EPI equation. This calculation has not been validated in all clinical situations. eGFR's persistently <60 mL/min signify possible Chronic Kidney Disease.    Anion gap 9 5 - 15    Imaging / Studies: No results found.  Medications / Allergies: per chart  Antibiotics: Anti-infectives    Start     Dose/Rate Route Frequency Ordered Stop   02/26/15 1800  vancomycin (VANCOCIN) IVPB 750 mg/150 ml premix  Status:  Discontinued     750 mg 150 mL/hr over 60 Minutes Intravenous Every 12 hours 02/26/15 0626 02/26/15 0746   02/26/15 0800  piperacillin-tazobactam (ZOSYN) IVPB 3.375 g     3.375 g 12.5 mL/hr over 240 Minutes Intravenous Every 8 hours 02/26/15 0626     02/26/15 0630  fluconazole (DIFLUCAN) IVPB 200 mg     200 mg 100 mL/hr over 60 Minutes Intravenous Every 24 hours 02/26/15 0618     02/26/15 0630  vancomycin (VANCOCIN) IVPB 750 mg/150 ml premix  Status:  Discontinued     750 mg 150 mL/hr over 60 Minutes Intravenous  Once 02/26/15 0626 02/26/15 0746   02/26/15 0130   piperacillin-tazobactam (ZOSYN) IVPB 3.375 g     3.375 g 100 mL/hr over 30 Minutes Intravenous  Once 02/26/15 0115 02/26/15 0248        Note: Portions of this report may have been transcribed using voice recognition software. Every effort was  made to ensure accuracy; however, inadvertent computerized transcription errors may be present.   Any transcriptional errors that result from this process are unintentional.     Adin Hector, M.D., F.A.C.S. Gastrointestinal and Minimally Invasive Surgery [facility] [address] [place], [place] [address] [phone] Main / Paging   02/28/2015  CARE TEAM:  PCP: No PCP Per Patient  Outpatient Care Team: Patient Care Team: No Pcp Per Patient as PCP - General (General Practice)  Inpatient Treatment Team: Treatment Team: Attending Provider: Md Edison Pace, MD; Rounding Team: Md Edison Pace, MD; Technician: Army Chaco, NT; Registered Nurse: Donzetta Matters, RN; Registered Nurse: Candida Peeling, RN; Registered Nurse: Clydia Llano, RN; Technician: Francene Finders, NT

## 2015-03-01 MED ORDER — FLUCONAZOLE IN SODIUM CHLORIDE 200-0.9 MG/100ML-% IV SOLN: 200.0000 mg | INTRAVENOUS | Status: DC

## 2015-03-01 NOTE — Progress Notes (Signed)
ANTIBIOTIC CONSULT NOTE  Pharmacy Consult for Zosyn Indication: peritonitis  Allergies  Allergen Reactions  . Hydrocodone Nausea Only  . Oxycodone Nausea Only    Patient Measurements: Height: 6\' 5"  (195.6 cm) Weight: 215 lb (97.523 kg) IBW/kg (Calculated) : 89.1  Vital Signs: Temp: 97.8 F (36.6 C) (06/26 0644) BP: 126/73 mmHg (06/26 0644) Pulse Rate: 95 (06/26 0644) Intake/Output from previous day: 06/25 0701 - 06/26 0700 In: 3132.2 [P.O.:850; I.V.:1812.2; IV Piggyback:350] Out: 2600 [Urine:1500; Emesis/NG output:1100] Intake/Output from this shift:    Labs:  Recent Labs  02/27/15 0313 02/28/15 0335  WBC 10.1 10.8*  HGB 14.5 13.7  PLT 183 186  CREATININE 1.49* 1.05   Estimated Creatinine Clearance: 93.1 mL/min (by C-G formula based on Cr of 1.05).  Assessment: 64yo male c/o abdominal pain x2wk, associated w/ N/V and constipation, noted has been taking Goody powders for the pain, now s/p ex-lap for perforation, distal stomach found to be inflamed, to begin IV ABX for peritonitis. Intra-op cultures grew out yeast and was also started on fluconazole.  Vanc 6/23 x1 Zosyn 6/23 >> Diflucan 6/23 >>  6/23 Body fluid culture: few GPC, rare yeast 6/23 Anaerobic: NGTD 6/23 MRSA pcr neg  SCr has improved from admission- currently 1.05 with est CrCl ~90-83mL/min.  Goal of Therapy:  Eradication of infection  Plan:  - Zosyn 3.375g IV Q8H EI - fluconazole 200mg  IV q24h per MD - monitor CBC, Cx, LOT  Natalie Mceuen D. Justo Hengel, PharmD, BCPS Clinical Pharmacist Pager: [phone] 03/01/2015 1:52 PM

## 2015-03-01 NOTE — Progress Notes (Signed)
3 Days Post-Op  Subjective: Stable and alert.  Wants to know when he can go home. No bowel movement yet. Afebrile.  Heart rate 95. No labs today. NG output thin, 1100 mL per 24 hours Cultures growing a few gram-positive cocci and rare yeast.  Objective: Vital signs in last 24 hours: Temp:  [97.8 F (36.6 C)-98.9 F (37.2 C)] 97.8 F (36.6 C) (06/26 0644) Pulse Rate:  [74-95] 95 (06/26 0644) Resp:  [12-22] 17 (06/26 0841) BP: (126-155)/(65-95) 126/73 mmHg (06/26 0644) SpO2:  [92 %-96 %] 94 % (06/26 0841) Last BM Date: 02/23/15  Intake/Output from previous day: 06/25 0701 - 06/26 0700 In: 3132.2 [P.O.:850; I.V.:1812.2; IV Piggyback:350] Out: 2600 [Urine:1500; Emesis/NG output:1100] Intake/Output this shift:    General appearance: Alert.  Mental status normal.  Cooperative.  No distress. Resp: clear to auscultation bilaterally GI: Abdomen soft.  Not distended.  Hypoactive bowel sounds.  Midline incision packed.  Minimal tenderness.  Lab Results:  No results found for this or any previous visit (from the past 24 hour(s)).   Studies/Results: No results found.  Marland Kitchen antiseptic oral rinse  7 mL Mouth Rinse q12n4p  . chlorhexidine  15 mL Mouth Rinse BID  . fluconazole (DIFLUCAN) IV  200 mg Intravenous Q24H  . heparin  5,000 Units Subcutaneous 3 times per day  . HYDROmorphone PCA 0.3 mg/mL   Intravenous 6 times per day  . lip balm  1 application Topical BID  . pantoprazole (PROTONIX) IV  80 mg Intravenous Q12H  . piperacillin-tazobactam (ZOSYN)  IV  3.375 g Intravenous Q8H     Assessment/Plan: s/p Procedure(s): EXPLORATORY LAPAROTOMY WITH GRAHAM PATCH REPAIR OF GASTRIC ULCER  POD #3.  Laparotomy, biopsy and Graham patch repair of perforated gastric ulcer. Continue Zosyn.  Add  Diflucan due to yeast in culture Check pathology Needs more ambulation Check labs tomorrow Water-soluble upper GI tomorrow to rule out leak.  Tobacco abuse.  Encouraged to quit.  @PROBHOSP @  LOS: 3 days    Chad Wiggins M 03/01/2015  . .prob

## 2015-03-02 ENCOUNTER — Inpatient Hospital Stay (HOSPITAL_COMMUNITY): Payer: Non-veteran care

## 2015-03-02 LAB — BASIC METABOLIC PANEL
Anion gap: 6 (ref 5–15)
BUN: 7 mg/dL (ref 6–20)
CHLORIDE: 102 mmol/L (ref 101–111)
CO2: 28 mmol/L (ref 22–32)
Calcium: 8.4 mg/dL — ABNORMAL LOW (ref 8.9–10.3)
Creatinine, Ser: 0.91 mg/dL (ref 0.61–1.24)
GFR calc non Af Amer: >60 mL/min (ref >60)
GLUCOSE: 108 mg/dL — AB (ref 65–99)
Potassium: 3.3 mmol/L — ABNORMAL LOW (ref 3.5–5.1)
SODIUM: 136 mmol/L (ref 135–145)

## 2015-03-02 LAB — CBC
HEMATOCRIT: 38 % — AB (ref 39.0–52.0)
Hemoglobin: 12.6 g/dL — ABNORMAL LOW (ref 13.0–17.0)
MCH: 31.7 pg (ref 26.0–34.0)
MCHC: 33.2 g/dL (ref 30.0–36.0)
MCV: 95.5 fL (ref 78.0–100.0)
Platelets: 203 10*3/uL (ref 150–400)
RBC: 3.98 MIL/uL — ABNORMAL LOW (ref 4.22–5.81)
RDW: 13.1 % (ref 11.5–15.5)
WBC: 7.4 10*3/uL (ref 4.0–10.5)

## 2015-03-02 LAB — BODY FLUID CULTURE

## 2015-03-02 MED ORDER — POTASSIUM CHLORIDE 10 MEQ/100ML IV SOLN
10.0000 meq | INTRAVENOUS | Status: AC
  Administered 2015-03-02 (×4): 10 meq via INTRAVENOUS
  Filled 2015-03-02 (×4): qty 100

## 2015-03-02 MED ORDER — IOHEXOL 300 MG/ML  SOLN
300.0000 mL | Freq: Once | INTRAMUSCULAR | Status: AC | PRN
  Administered 2015-03-02: 130 mL via ORAL

## 2015-03-02 NOTE — Progress Notes (Signed)
[facility] Progress Note  4 Days Post-Op  Subjective: Pt doing well, having watery diarrhea and flatus.  No N/V, tolerating ice.  Ambulating well.  Hungry/thirsty.  Wants NG out.  Urinating fine.    Objective: Vital signs in last 24 hours: Temp:  [98 F (36.7 C)-99.7 F (37.6 C)] 98.5 F (36.9 C) (06/27 0617) Pulse Rate:  [62-81] 76 (06/27 0617) Resp:  [11-26] 12 (06/27 0617) BP: (114-152)/(77-92) 135/77 mmHg (06/27 0617) SpO2:  [1 %-98 %] 95 % (06/27 0617) FiO2 (%):  [93 %] 93 % (06/26 1206) Last BM Date: 02/23/15  Intake/Output from previous day: 06/26 0701 - 06/27 0700 In: 2845 [P.O.:420; I.V.:1875; IV Piggyback:550] Out: 1125 [Urine:975; Emesis/NG output:150] Intake/Output this shift:    PE: Gen:  Alert, NAD, pleasant Abd: Soft, NT/ND, +BS, no HSM, incisions C/D/I, drain with minimal sanguinous drainage, no abdominal scars noted   Lab Results:   Recent Labs  02/28/15 0335 03/02/15 0510  WBC 10.8* 7.4  HGB 13.7 12.6*  HCT 42.0 38.0*  PLT 186 203   BMET  Recent Labs  02/28/15 0335 03/02/15 0510  NA 139 136  K 4.1 3.3*  CL 102 102  CO2 28 28  GLUCOSE 118* 108*  BUN 20 7  CREATININE 1.05 0.91  CALCIUM 8.8* 8.4*   PT/INR No results for input(s): LABPROT, INR in the last 72 hours. CMP     Component Value Date/Time   NA 136 03/02/2015 0510   K 3.3* 03/02/2015 0510   CL 102 03/02/2015 0510   CO2 28 03/02/2015 0510   GLUCOSE 108* 03/02/2015 0510   BUN 7 03/02/2015 0510   CREATININE 0.91 03/02/2015 0510   CALCIUM 8.4* 03/02/2015 0510   PROT 8.5* 02/25/2015 2301   ALBUMIN 4.6 02/25/2015 2301   AST 36 02/25/2015 2301   ALT 40 02/25/2015 2301   ALKPHOS 117 02/25/2015 2301   BILITOT 0.7 02/25/2015 2301   GFRNONAA >60 03/02/2015 0510   GFRAA >60 03/02/2015 0510   Lipase     Component Value Date/Time   LIPASE 32 02/25/2015 2301       Studies/Results: No results found.  Anti-infectives: Anti-infectives    Start      Dose/Rate Route Frequency Ordered Stop   03/01/15 0915  fluconazole (DIFLUCAN) IVPB 200 mg  Status:  Discontinued     200 mg 100 mL/hr over 60 Minutes Intravenous Every 24 hours 03/01/15 0901 03/01/15 0911   02/26/15 1800  vancomycin (VANCOCIN) IVPB 750 mg/150 ml premix  Status:  Discontinued     750 mg 150 mL/hr over 60 Minutes Intravenous Every 12 hours 02/26/15 0626 02/26/15 0746   02/26/15 0800  piperacillin-tazobactam (ZOSYN) IVPB 3.375 g     3.375 g 12.5 mL/hr over 240 Minutes Intravenous Every 8 hours 02/26/15 0626     02/26/15 0630  fluconazole (DIFLUCAN) IVPB 200 mg     200 mg 100 mL/hr over 60 Minutes Intravenous Every 24 hours 02/26/15 0618     02/26/15 0630  vancomycin (VANCOCIN) IVPB 750 mg/150 ml premix  Status:  Discontinued     750 mg 150 mL/hr over 60 Minutes Intravenous  Once 02/26/15 0626 02/26/15 0746   02/26/15 0130  piperacillin-tazobactam (ZOSYN) IVPB 3.375 g     3.375 g 100 mL/hr over 30 Minutes Intravenous  Once 02/26/15 0115 02/26/15 0248       Assessment/Plan Perforated gastric ulcer POD #4 exploratory laparotomy biopsy and graham patch repair---Dr. Donell Beers  -D/c NG tube, start sips, if  tolerating start clears tray at dinner -UGI today without leak -D/C PCA, IVP, start orals tomorrow -PPI  -Ambulate and IS -Cultures growing gram pos cocci and rare yeast -Leukocytosis resolved and afebrile -Stomach, biopsy, Gastric - FIBRINOPURULENT MATERIAL, CONSISTENT WITH ULCER BED. - THERE IS NO EVIDENCE OF DYSPLASIA OR MALIGNANCY.  VTE prophylaxis-SCD/heparin ID-fluconazole, zosyn D#5/7, d/c vanc FEN-NPO, IVF Hypokalemia - 3.3 today, IVF, KCl supplement, monitor closely AKI-resolved EtOH use-does not use daily, does not need CIWA Dispo-On floor     LOS: 4 days    Nonie HoyerMegan N Baird 03/02/2015, 8:41 AM Pager: [phone]  Continues to improve. No sign of leak on today's UGI Replete K Continue abx.  Wilmon ArmsMatthew K. Corliss Skainssuei, MD, [facility]   General/ Trauma Surgery  03/02/2015 12:00 PM

## 2015-03-02 NOTE — Progress Notes (Signed)
Pt was presssing the PCA pump maximum limit and the pump was beeping even I explained that there's a limit.

## 2015-03-02 NOTE — Care Management Note (Signed)
Case Management Note  Patient Details  Name: Peggye FormWilliam D Seals MRN: [id number] Date of Birth: 11/25/1950  Subjective/Objective:                    Action/Plan: UR updated  Expected Discharge Date:            03-05-15      Expected Discharge Plan:  Home/Self Care  In-House Referral:     Discharge planning Services     Post Acute Care Choice:    Choice offered to:     DME Arranged:    DME Agency:     HH Arranged:    HH Agency:     Status of Service:  In process, will continue to follow  Medicare Important Message Given:    Date Medicare IM Given:    Medicare IM give by:    Date Additional Medicare IM Given:    Additional Medicare Important Message give by:     If discussed at Long Length of Stay Meetings, dates discussed:    Additional Comments:  [name], Abas Leicht Marie, RN 03/02/2015, 2:38 PM

## 2015-03-03 LAB — ANAEROBIC CULTURE

## 2015-03-03 MED ORDER — HYDROMORPHONE HCL 1 MG/ML IJ SOLN
1.0000 mg | Freq: Four times a day (QID) | INTRAMUSCULAR | Status: DC | PRN
  Administered 2015-03-03 (×2): 2 mg via INTRAVENOUS
  Administered 2015-03-04: 1 mg via INTRAVENOUS
  Filled 2015-03-03 (×2): qty 2
  Filled 2015-03-03: qty 1

## 2015-03-03 MED ORDER — TRAMADOL HCL 50 MG PO TABS
50.0000 mg | ORAL_TABLET | Freq: Four times a day (QID) | ORAL | Status: DC | PRN
Start: 2015-03-03 — End: 2015-03-04
  Administered 2015-03-03: 50 mg via ORAL
  Filled 2015-03-03: qty 1

## 2015-03-03 MED ORDER — PANTOPRAZOLE SODIUM 40 MG PO TBEC
40.0000 mg | DELAYED_RELEASE_TABLET | Freq: Two times a day (BID) | ORAL | Status: DC
  Administered 2015-03-03 – 2015-03-04 (×2): 40 mg via ORAL
  Filled 2015-03-03 (×2): qty 1

## 2015-03-03 NOTE — Progress Notes (Signed)
[facility] Progress Note  5 Days Post-Op  Subjective: Doing well.  No N/V, tolerating clears well.  Ambulating well.  Good urinating, flatus and liquid brown stools.  Hungry/thirsty.  Pain well controlled.    Objective: Vital signs in last 24 hours: Temp:  [97.7 F (36.5 C)-99.1 F (37.3 C)] 97.7 F (36.5 C) (06/28 0630) Pulse Rate:  [69-76] 69 (06/28 0630) Resp:  [16-17] 17 (06/28 0630) BP: (123-134)/(70-90) 129/90 mmHg (06/28 0630) SpO2:  [94 %-96 %] 96 % (06/28 0630) Last BM Date: 02/23/15  Intake/Output from previous day: 06/27 0701 - 06/28 0700 In: 1000 [P.O.:1000] Out: 1853 [Urine:1853] Intake/Output this shift:    PE: Gen:  Alert, NAD, pleasant Abd: Soft, mild tenderness, mild distension, +BS, no HSM, open midline wound with good granulation tissue, no significant drainage, no peri-wound erythema.   Lab Results:   Recent Labs  03/02/15 0510  WBC 7.4  HGB 12.6*  HCT 38.0*  PLT 203   BMET  Recent Labs  03/02/15 0510  NA 136  K 3.3*  CL 102  CO2 28  GLUCOSE 108*  BUN 7  CREATININE 0.91  CALCIUM 8.4*   PT/INR No results for input(s): LABPROT, INR in the last 72 hours. CMP     Component Value Date/Time   NA 136 03/02/2015 0510   K 3.3* 03/02/2015 0510   CL 102 03/02/2015 0510   CO2 28 03/02/2015 0510   GLUCOSE 108* 03/02/2015 0510   BUN 7 03/02/2015 0510   CREATININE 0.91 03/02/2015 0510   CALCIUM 8.4* 03/02/2015 0510   PROT 8.5* 02/25/2015 2301   ALBUMIN 4.6 02/25/2015 2301   AST 36 02/25/2015 2301   ALT 40 02/25/2015 2301   ALKPHOS 117 02/25/2015 2301   BILITOT 0.7 02/25/2015 2301   GFRNONAA >60 03/02/2015 0510   GFRAA >60 03/02/2015 0510   Lipase     Component Value Date/Time   LIPASE 32 02/25/2015 2301       Studies/Results: Dg Ugi W/water Sol Cm  03/02/2015   CLINICAL DATA:  Status post repair of a gastric perforation.  EXAM: WATER SOLUBLE UPPER GI SERIES  TECHNIQUE: Single-column upper GI series was  performed using water soluble contrast.  CONTRAST:  130mL OMNIPAQUE IOHEXOL 300 MG/ML  SOLN  COMPARISON:  CT scan 02/26/2015  FLUOROSCOPY TIME:  Radiation Exposure Index (as provided by the fluoroscopic device):  If the device does not provide the exposure index:  Fluoroscopy Time (in minutes and seconds):  1 min and 0 seconds  Number of Acquired Images:  0  FINDINGS: Water-soluble contrast material was injected through the existing NG tube. The stomach is normal. No leaking contrast was identified. Normal emptying into the duodenum. Small amount GE reflux was noted.  IMPRESSION: Normal water-soluble upper GI study. No extravasation/leak of contrast.   Electronically Signed   By: Rudie MeyerP.  Gallerani M.D.   On: 03/02/2015 09:07    Anti-infectives: Anti-infectives    Start     Dose/Rate Route Frequency Ordered Stop   03/01/15 0915  fluconazole (DIFLUCAN) IVPB 200 mg  Status:  Discontinued     200 mg 100 mL/hr over 60 Minutes Intravenous Every 24 hours 03/01/15 0901 03/01/15 0911   02/26/15 1800  vancomycin (VANCOCIN) IVPB 750 mg/150 ml premix  Status:  Discontinued     750 mg 150 mL/hr over 60 Minutes Intravenous Every 12 hours 02/26/15 0626 02/26/15 0746   02/26/15 0800  piperacillin-tazobactam (ZOSYN) IVPB 3.375 g     3.375 g 12.5  mL/hr over 240 Minutes Intravenous Every 8 hours 02/26/15 0626     02/26/15 0630  fluconazole (DIFLUCAN) IVPB 200 mg     200 mg 100 mL/hr over 60 Minutes Intravenous Every 24 hours 02/26/15 0618     02/26/15 0630  vancomycin (VANCOCIN) IVPB 750 mg/150 ml premix  Status:  Discontinued     750 mg 150 mL/hr over 60 Minutes Intravenous  Once 02/26/15 0626 02/26/15 0746   02/26/15 0130  piperacillin-tazobactam (ZOSYN) IVPB 3.375 g     3.375 g 100 mL/hr over 30 Minutes Intravenous  Once 02/26/15 0115 02/26/15 0248       Assessment/Plan Perforated gastric ulcer POD #5 exploratory laparotomy biopsy and graham patch repair---Dr. Donell Beers  -UGI without leak, having BM's and  lots of flatus -NG tube d/c 6/27, tolerating clears, advance to fulls, soft in am if doing well -IVP/oral meds -PPI oral -Ambulate and IS -Cultures growing gram pos cocci and rare yeast -Leukocytosis resolved and afebrile -Stomach, biopsy, Gastric - FIBRINOPURULENT MATERIAL, CONSISTENT WITH ULCER BED. - THERE IS NO EVIDENCE OF DYSPLASIA OR MALIGNANCY.  VTE prophylaxis-SCD/heparin ID-fluconazole, zosyn D#6/7, d/c vanc FEN-NPO, IVF Hypokalemia - 3.3 yesterday, IVF  AKI-resolved EtOH use-does not use daily, does not need CIWA Dispo-On floor    LOS: 5 days    Nonie Hoyer 03/03/2015, 8:23 AM Pager: [phone]

## 2015-03-03 NOTE — Care Management Note (Signed)
Case Management Note  Patient Details  Name: Chad Wiggins MRN: [id number] Date of Birth: 03/23/1951  Subjective/Objective:                    Action/Plan:  Confirmed face sheet information with patient . Patient agreeable to home health RN depending on cost .  [facility] , she will see patient this afternoon.  Expected Discharge Date:     03-04-15              Expected Discharge Plan:  Home w Home Health Services  In-House Referral:     Discharge planning Services     Post Acute Care Choice:    Choice offered to:  Patient  DME Arranged:    DME Agency:     HH Arranged:  RN HH Agency:  [facility]  Status of Service:  In process, will continue to follow  Medicare Important Message Given:    Date Medicare IM Given:    Medicare IM give by:    Date Additional Medicare IM Given:    Additional Medicare Important Message give by:     If discussed at Long Length of Stay Meetings, dates discussed:    Additional Comments:  [name], [name] Marie, RN 03/03/2015, 9:48 AM

## 2015-03-04 ENCOUNTER — Encounter (HOSPITAL_COMMUNITY): Payer: Self-pay | Admitting: General Surgery

## 2015-03-04 DIAGNOSIS — K279 Peptic ulcer, site unspecified, unspecified as acute or chronic, without hemorrhage or perforation: Secondary | ICD-10-CM | POA: Diagnosis present

## 2015-03-04 DIAGNOSIS — Z720 Tobacco use: Secondary | ICD-10-CM | POA: Diagnosis present

## 2015-03-04 DIAGNOSIS — K255 Chronic or unspecified gastric ulcer with perforation: Secondary | ICD-10-CM | POA: Diagnosis present

## 2015-03-04 MED ORDER — PROMETHAZINE HCL 25 MG PO TABS
12.5000 mg | ORAL_TABLET | Freq: Three times a day (TID) | ORAL | Status: DC | PRN
  Administered 2015-03-04: 25 mg via ORAL
  Filled 2015-03-04: qty 1

## 2015-03-04 MED ORDER — ONDANSETRON HCL 4 MG PO TABS: 4.0000 mg | ORAL_TABLET | Freq: Three times a day (TID) | ORAL | Status: AC | PRN

## 2015-03-04 MED ORDER — OXYCODONE-ACETAMINOPHEN 5-325 MG PO TABS: 1.0000 | ORAL_TABLET | ORAL | Status: AC | PRN

## 2015-03-04 MED ORDER — OMEPRAZOLE 40 MG PO CPDR: 40.0000 mg | DELAYED_RELEASE_CAPSULE | Freq: Two times a day (BID) | ORAL | Status: AC

## 2015-03-04 MED ORDER — OXYCODONE-ACETAMINOPHEN 5-325 MG PO TABS
1.0000 | ORAL_TABLET | ORAL | Status: DC | PRN
  Administered 2015-03-04: 1 via ORAL
  Filled 2015-03-04: qty 2

## 2015-03-04 MED ORDER — PROMETHAZINE HCL 12.5 MG PO TABS: 12.5000 mg | ORAL_TABLET | Freq: Three times a day (TID) | ORAL | Status: AC | PRN

## 2015-03-04 MED ORDER — TRAMADOL HCL 50 MG PO TABS: 50.0000 mg | ORAL_TABLET | Freq: Four times a day (QID) | ORAL | Status: DC | PRN

## 2015-03-04 MED ORDER — BISACODYL 10 MG RE SUPP: 10.0000 mg | Freq: Every day | RECTAL | Status: AC | PRN

## 2015-03-04 NOTE — Progress Notes (Signed)
Discharge instructions reviewed with pt and pt's family, prescriptions given and instructed on where to pick up remaining prescription.  Also demonstrated to pt's family how to do the dressing change.  Pt and pt's family verbalized understanding and had no questions.  Pt discharged in stable condition via wheelchair with family.  Hector ShadeMoss, Pennye Beeghly IvylandLindsay

## 2015-03-04 NOTE — Plan of Care (Signed)
Problem: Food- and Nutrition-Related Knowledge Deficit (NB-1.1) Goal: Nutrition education Formal process to instruct or train a patient/client in a skill or to impart knowledge to help patients/clients voluntarily manage or modify food choices and eating behavior to maintain or improve health. Outcome: Adequate for Discharge  RD consulted for nutrition education regarding PUD/GERD.   RD provided "Peptic Ulcer Disease Nutrition Therapy" handout from the Academy of Nutrition and Dietetics. Discussed different food groups and ways to make healthier foods choices. Encouraged a general, healthy diet rich in fruits, vegetables, whole grains, low fat dairy, and lean proteins. Encouraged pt to monitor food intake and avoid common trigger foods for GERD. Teach back method used.  Expect fair to good compliance.  Body mass index is 25.49 kg/(m^2). Pt meets criteria for overweight based on current BMI.  Current diet order is soft, patient is consuming approximately 100% of meals at this time. Labs and medications reviewed. No further nutrition interventions warranted at this time. RD contact information provided. If additional nutrition issues arise, please re-consult RD.  Shaniya Tashiro A. Mayford KnifeWilliams, RD, LDN, CDE Pager: [phone] After hours Pager: [phone]

## 2015-03-04 NOTE — Discharge Instructions (Signed)
YOU NEED TO AVOID ALL ANTI-INFLAMMATORY MEDICATIONS LIKE ASPIRIN, IBUPROFEN, ADVIL, ALEEVE, GOODYS POWDERS.  THESE MEDICATIONS CAN MAKE YOU AT RISK FOR ULCER DISEASE  Food Choices for Peptic Ulcer Disease When you have peptic ulcer disease, the foods you eat and your eating habits are very important. Choosing the right foods can help ease the discomfort of peptic ulcer disease. WHAT GENERAL GUIDELINES DO I NEED TO FOLLOW?  Choose fruits, vegetables, whole grains, and low-fat meat, fish, and poultry.   Keep a food diary to identify foods that cause symptoms.  Avoid foods that cause irritation or pain. These may be different for different people.  Eat frequent small meals instead of three large meals each day. The pain may be worse when your stomach is empty.  Avoid eating close to bedtime. WHAT FOODS ARE NOT RECOMMENDED? The following are some foods and drinks that may worsen your symptoms:  Black, white, and red pepper.  Hot sauce.  Chili peppers.  Chili powder.  Chocolate and cocoa.   Alcohol.  Tea, coffee, and cola (regular and decaffeinated). The items listed above may not be a complete list of foods and beverages to avoid. Contact your dietitian for more information. Document Released: 11/14/2011 Document Revised: 08/27/2013 Document Reviewed: 06/26/2013 [facility] Patient Information 2015 [place]. This information is not intended to replace advice given to you by your health care provider. Make sure you discuss any questions you have with your health care provider.  Peptic Ulcer A peptic ulcer is a sore in the lining of your esophagus (esophageal ulcer), stomach (gastric ulcer), or in the first part of your small intestine (duodenal ulcer). The ulcer causes erosion into the deeper tissue. CAUSES  Normally, the lining of the stomach and the small intestine protects itself from the acid that digests food. The protective lining can be damaged by:  An infection  caused by a bacterium called Helicobacter pylori (H. pylori).  Regular use of nonsteroidal anti-inflammatory drugs (NSAIDs), such as ibuprofen or aspirin.  Smoking tobacco. Other risk factors include being older than 50, drinking alcohol excessively, and having a family history of ulcer disease.  SYMPTOMS   Burning pain or gnawing in the area between the chest and the belly button.  Heartburn.  Nausea and vomiting.  Bloating. The pain can be worse on an empty stomach and at night. If the ulcer results in bleeding, it can cause:  Black, tarry stools.  Vomiting of bright red blood.  Vomiting of coffee-ground-looking materials. DIAGNOSIS  A diagnosis is usually made based upon your history and an exam. Other tests and procedures may be performed to find the cause of the ulcer. Finding a cause will help determine the best treatment. Tests and procedures may include:  Blood tests, stool tests, or breath tests to check for the bacterium H. pylori.  An upper gastrointestinal (GI) series of the esophagus, stomach, and small intestine.  An endoscopy to examine the esophagus, stomach, and small intestine.  A biopsy. TREATMENT  Treatment may include:  Eliminating the cause of the ulcer, such as smoking, NSAIDs, or alcohol.  Medicines to reduce the amount of acid in your digestive tract.  Antibiotic medicines if the ulcer is caused by the H. pylori bacterium.  An upper endoscopy to treat a bleeding ulcer.  Surgery if the bleeding is severe or if the ulcer created a hole somewhere in the digestive system. HOME CARE INSTRUCTIONS   Avoid tobacco, alcohol, and caffeine. Smoking can increase the acid in the stomach, and  continued smoking will impair the healing of ulcers.  Avoid foods and drinks that seem to cause discomfort or aggravate your ulcer.  Only take medicines as directed by your caregiver. Do not substitute over-the-counter medicines for prescription medicines without  talking to your caregiver.  Keep any follow-up appointments and tests as directed. SEEK MEDICAL CARE IF:   Your do not improve within 7 days of starting treatment.  You have ongoing indigestion or heartburn. SEEK IMMEDIATE MEDICAL CARE IF:   You have sudden, sharp, or persistent abdominal pain.  You have bloody or dark black, tarry stools.  You vomit blood or vomit that looks like coffee grounds.  You become light-headed, weak, or feel faint.  You become sweaty or clammy. MAKE SURE YOU:   Understand these instructions.  Will watch your condition.  Will get help right away if you are not doing well or get worse. Document Released: 08/19/2000 Document Revised: 01/06/2014 Document Reviewed: 03/21/2012 [facility] Patient Information 2015 [place], [place]. This information is not intended to replace advice given to you by your health care provider. Make sure you discuss any questions you have with your health care provider.  Dressing Change A dressing is a material placed over wounds. It keeps the wound clean, dry, and protected from further injury. This provides an environment that favors wound healing.  BEFORE YOU BEGIN  Get your supplies together. Things you may need include:  Saline solution.  Flexible gauze dressing.  Medicated cream.  Tape.  Gloves.  Abdominal dressing pads.  Gauze squares.  Plastic bags.  Take pain medicine 30 minutes before the dressing change if you need it.  Take a shower before you do the first dressing change of the day. Use plastic wrap or a plastic bag to prevent the dressing from getting wet. REMOVING YOUR OLD DRESSING   Wash your hands with soap and water. Dry your hands with a clean towel.  Put on your gloves.  Remove any tape.  Carefully remove the old dressing. If the dressing sticks, you may dampen it with warm water to loosen it, or follow your caregiver's specific directions.  Remove any gauze or packing tape that is in  your wound.  Take off your gloves.  Put the gloves, tape, gauze, or any packing tape into a plastic bag. CHANGING YOUR DRESSING  Open the supplies.  Take the cap off the saline solution.  Open the gauze package so that the gauze remains on the inside of the package.  Put on your gloves.  Clean your wound as told by your caregiver.  If you have been told to keep your wound dry, follow those instructions.  Your caregiver may tell you to do one or more of the following:  Pick up the gauze. Pour the saline solution over the gauze. Squeeze out the extra saline solution.  Put medicated cream or other medicine on your wound if you have been told to do so.  Put the solution soaked gauze only in your wound, not on the skin around it.  Pack your wound loosely or as told by your caregiver.  Put dry gauze on your wound.  Put abdominal dressing pads over the dry gauze if your wet gauze soaks through.  Tape the abdominal dressing pads in place so they will not fall off. Do not wrap the tape completely around the affected part (arm, leg, abdomen).  Wrap the dressing pads with a flexible gauze dressing to secure it in place.  Take off your gloves.  Put them in the plastic bag with the old dressing. Tie the bag shut and throw it away.  Keep the dressing clean and dry until your next dressing change.  Wash your hands. SEEK MEDICAL CARE IF:  Your skin around the wound looks red.  Your wound feels more tender or sore.  You see pus in the wound.  Your wound smells bad.  You have a fever.  Your skin around the wound has a rash that itches and burns.  You see black or yellow skin in your wound that was not there before.  You feel nauseous, throw up, and feel very tired. Document Released: 09/29/2004 Document Revised: 11/14/2011 Document Reviewed: 07/04/2011 [facility] Patient Information 2015 [place], [place]. This information is not intended to replace advice given to you by your  health care provider. Make sure you discuss any questions you have with your health care provider.

## 2015-03-04 NOTE — Discharge Summary (Signed)
[facility] Discharge Summary   Patient ID: Chad Wiggins MRN: [id number] DOB/AGE: 62-Jan-1952 62 y.o.  Admit date: 02/25/2015 Discharge date: 03/04/2015  Admitting Diagnosis: Perforated viscus Tobacco abuse Perforated gastric ulcer Peptic ulcer disease  Discharge Diagnosis Patient Active Problem List   Diagnosis Date Noted  . Perforated gastric ulcer 03/04/2015  . Tobacco abuse 03/04/2015  . Peptic ulcer disease 03/04/2015  . Perforated viscus 02/26/2015    Consultants None  Imaging: Dg Ugi W/water Sol Cm  03/02/2015   CLINICAL DATA:  Status post repair of a gastric perforation.  EXAM: WATER SOLUBLE UPPER GI SERIES  TECHNIQUE: Single-column upper GI series was performed using water soluble contrast.  CONTRAST:  OMNIPAQUE IOHEXOL 300 MG/ML  SOLN  COMPARISON:  CT scan 02/26/2015  FLUOROSCOPY TIME:  Radiation Exposure Index (as provided by the fluoroscopic device):  If the device does not provide the exposure index:  Fluoroscopy Time (in minutes and seconds):  1 min and 0 seconds  Number of Acquired Images:  0  FINDINGS: Water-soluble contrast material was injected through the existing NG tube. The stomach is normal. No leaking contrast was identified. Normal emptying into the duodenum. Small amount GE reflux was noted.  IMPRESSION: Normal water-soluble upper GI study. No extravasation/leak of contrast.   Electronically Signed   By: Rudie Meyer M.D.   On: 03/02/2015 09:07    Procedures Dr. Donell Beers (02/26/15) - Exploratory laparotomy, biopsy and graham patch repair of perforated gastric ulcer.    Hospital Course:  64 yo M who presents with 3 weeks of gradually worsening upper abdominal pain. It was acutely worse over this evening and he sought care in the ED. He had significant nausea/vomiting. He complains of bloating, and sweating. He denies f/c/d. He has had some constipation. He has been taking goody's powders for the last few weeks multiple times per  day because of the belly pain. He has never been diagnosed with ulcers or gastritis. The pain is worse with palpation, movement, or laying flat.   Workup showed perforated viscus, acute renal insufficiency, hypovolemia, and SIRS.  Patient was admitted and underwent procedure listed above.  Was found to have a perforated gastric ulcer.  Tolerated procedure well and was transferred to the floor.  He was managed with NPO and NG tube for several days.  A stomach biopsy showed fibrinopurulent material consistent with an ulcer bed and there was no evidence of malignancy or dysplasia.  His renal insufficiency soon improved with IV hydration.  On 03/02/15 an UGI was obtained which showed no gastric leak.  His NG was discontinued and started on clear sips, his diet was slowly advanced as tolerated.  Antibiotics were discontinued for a 7 days course on the day of discharge.  On POD #6, the patient was voiding well, tolerating diet, ambulating well, pain well controlled, vital signs stable, incisions c/d/i and felt stable for discharge home.  Patient will follow up in our office in 2 weeks and knows to call with questions or concerns.    He will be maintained on PPI (prilosec  BID until he can follow up with his PCP to discuss discontinuing it).  He was encouraged to quit smoking.  We had the dietitian give nutrition counseling and discuss PUD diet.  He was taught how to change his dressing which he will continue with the help of a friend.  He will continue dressing changes BID for 1 week then back off to once daily.  He is encouraged to  take a shower with the packing out and wash the wound with soap and water.  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  He will need at least 3-4 weeks off, he will have to be released for full duty by his surgeon Dr. Donell BeersByerly.     Physical Exam: General:  Alert, NAD, pleasant, comfortable Abd:  Soft, ND, mild tenderness, midline wound is clean beefy red with good  granulation tissue forming.       Medication List    TAKE these medications        ALPRAZolam 0.5 MG tablet  Commonly known as:  XANAX  Take 0.5 mg by mouth daily as needed for anxiety.     bisacodyl 10 MG suppository  Commonly known as:  DULCOLAX  Place 1 suppository (10 mg total) rectally daily as needed for mild constipation or moderate constipation.     omeprazole 40 MG capsule  Commonly known as:  PRILOSEC  Take 1 capsule (40 mg total) by mouth 2 (two) times daily before a meal. Take 30-60 minutes before a meal.     traMADol 50 MG tablet  Commonly known as:  ULTRAM  Take 1-2 tablets (50-100 mg total) by mouth every 6 (six) hours as needed for moderate pain or severe pain.         Follow-up Information    Follow up with [facility].   Why:  home health nurse    Contact information:   [address] [place] [address] [phone]       [facility]. Call in 3 weeks.   Specialty:  General Surgery   Why:  For post-operation check, call our office to confirm date and time of appointment with Dr. Donell BeersByerly in 2-3 weeks.  This visit is free and included in the cost of the surgery.   Contact information:   [address] [place] [address] [phone]       Follow up with [facility]    .   Why:  For post-hospital follow up.  This is a primary care provider that you can see that is low cost.     Contact information:   [address] [place] [id number] [phone]      Signed: Nonie HoyerMegan N. Zaara Sprowl, [facility] [phone]  03/04/2015, 8:43 AM

## 2015-03-04 NOTE — Care Management Note (Signed)
Case Management Note  Patient Details  Name: Chad Wiggins MRN: [id number] Date of Birth: 03/30/1951  Subjective/Objective:                    Action/Plan:   Expected Discharge Date:           03-04-15        Expected Discharge Plan:  Home w Home Health Services  In-House Referral:     Discharge planning Services  CM Consult, [facility], Medication Assistance  Post Acute Care Choice:    Choice offered to:  Patient  DME Arranged:    DME Agency:     HH Arranged:  RN HH Agency:  [facility]  Status of Service:  In process, will continue to follow  Medicare Important Message Given:    Date Medicare IM Given:    Medicare IM give by:    Date Additional Medicare IM Given:    Additional Medicare Important Message give by:     If discussed at Long Length of Stay Meetings, dates discussed:    Additional Comments:  [name], Hussein Macdougal Marie, RN 03/04/2015, 10:37 AM

## 2016-07-29 IMAGING — CT CT ABD-PELV W/O CM
2 of 3 series · 9 of 46 positions shown, 10 images · non-contrast
Comparison: None.

CLINICAL DATA: Subacute onset of generalized abdominal pain for 2
weeks. Nausea, vomiting and abdominal distention. Initial encounter.

EXAM:
CT ABDOMEN AND PELVIS WITHOUT CONTRAST
TECHNIQUE: Multidetector CT imaging of the abdomen and pelvis was performed
following the standard protocol without IV contrast.

[Series 201: routine, idose (2) · axial · 0.82mm/px · z∈[+9,+429]mm · 6 of 104 slices shown, 7 images]
[im 10/104  soft-tissue]
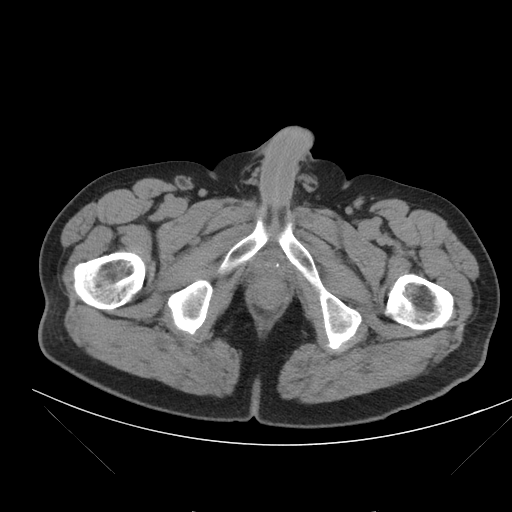
[im 10/104  bone]
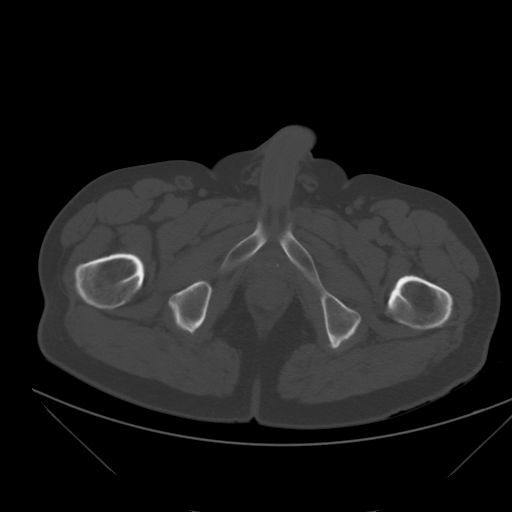
[im 27/104  soft-tissue]
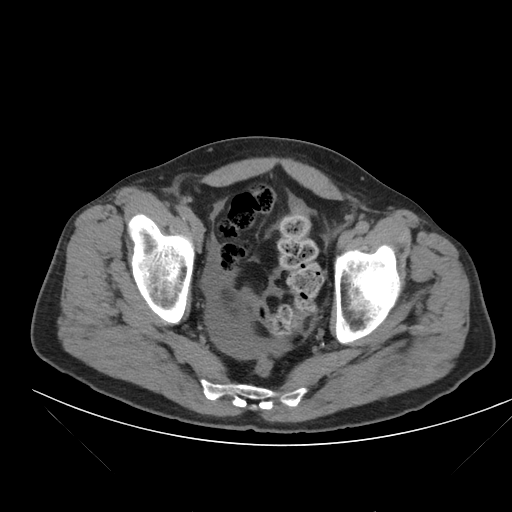
[im 44/104  soft-tissue]
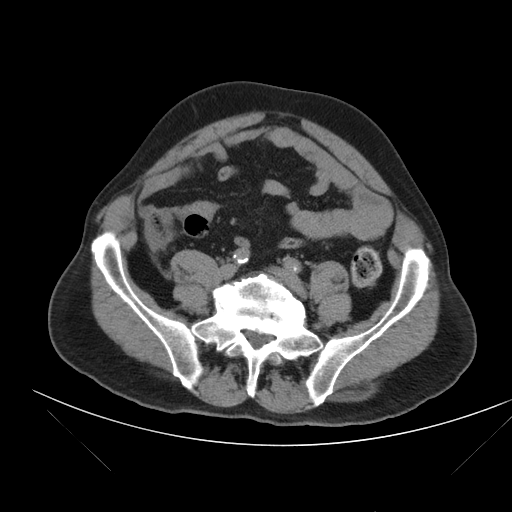
[im 60/104  soft-tissue]
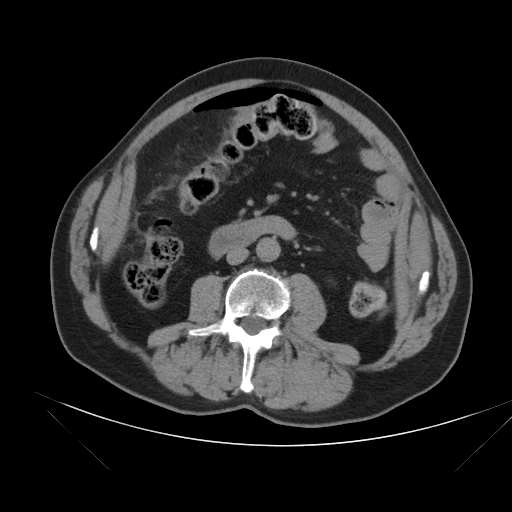
[im 77/104  soft-tissue]
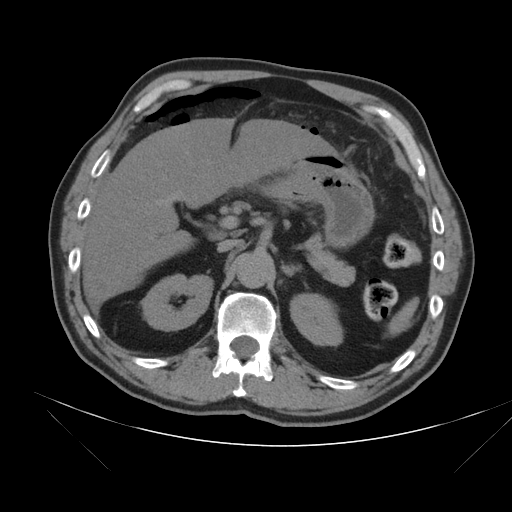
[im 94/104  soft-tissue]
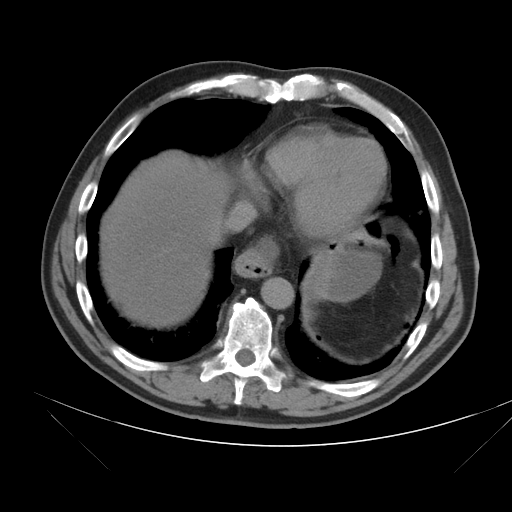

[Series 203: coronals, idose (2) · coronal · 0.45mm/px · 3 of 148 slices shown]
[im 50/148  soft-tissue]
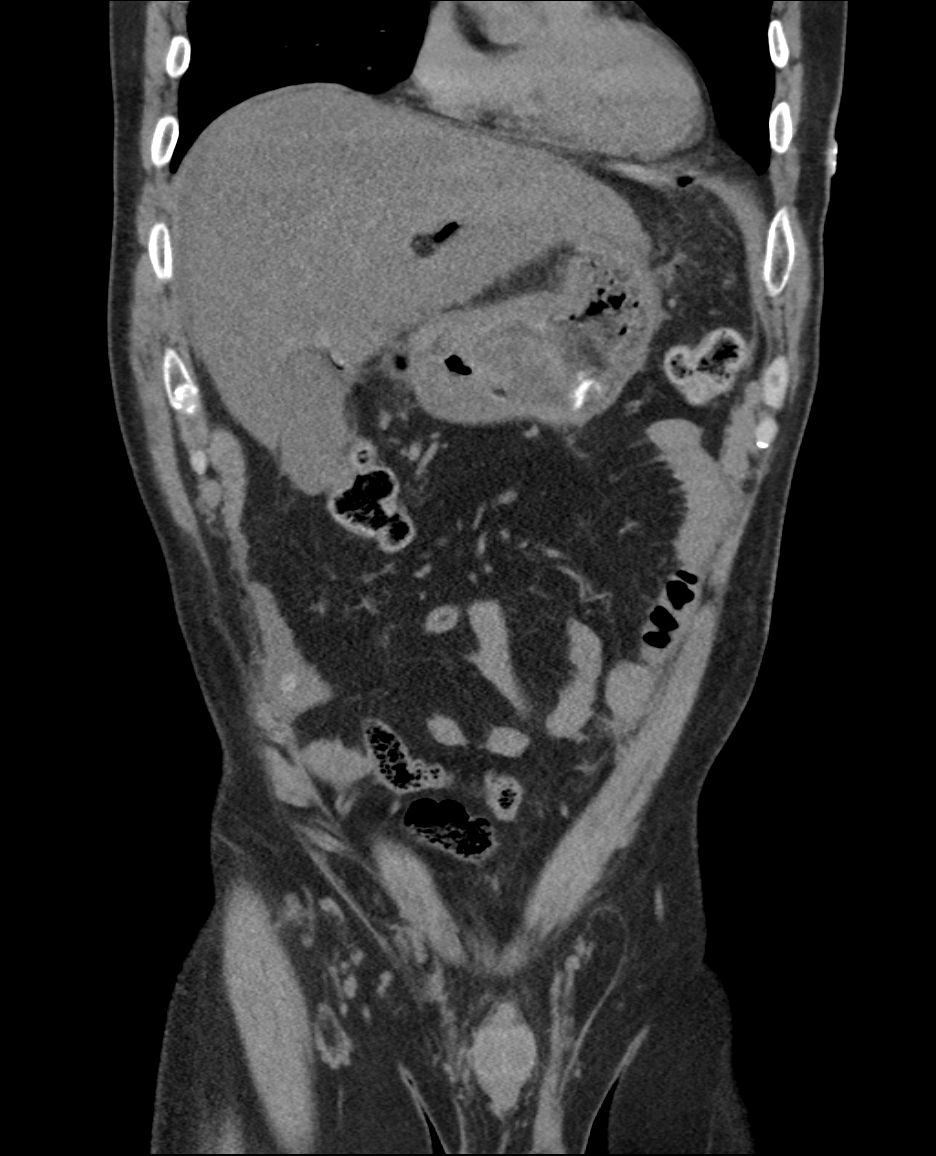
[im 66/148  soft-tissue]
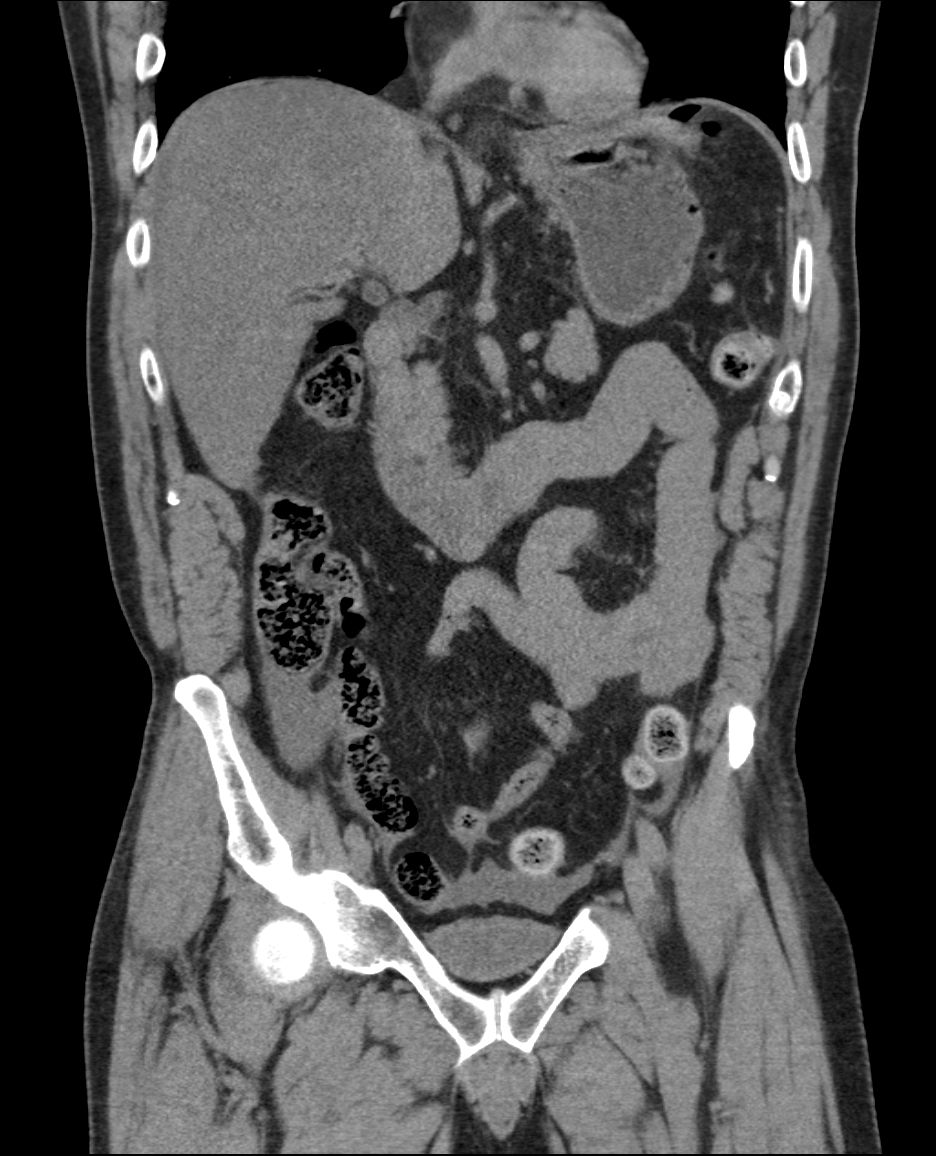
[im 82/148  soft-tissue]
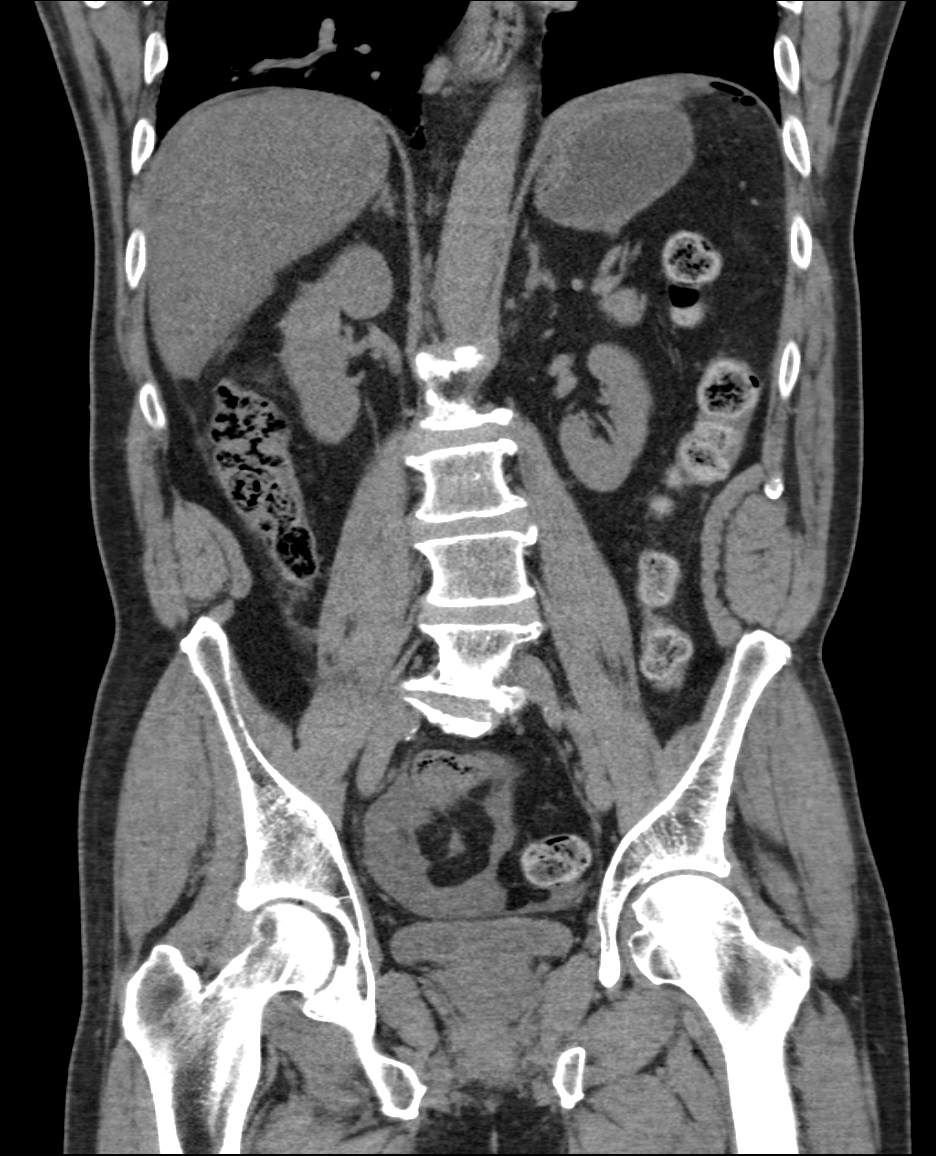

[9 of 46 positions shown; findings below may reference images not displayed]

FINDINGS: Mild bibasilar atelectasis or scarring is seen.

Significant free intra-abdominal air is noted within the abdomen,
with free fluid of borderline high attenuation tracking about the
liver and in the pelvis. This is compatible with perforation. The
site of perforation appears to be at the anterior aspect of the
antrum of the stomach. An underlying ulceration cannot be excluded.

Scattered nonspecific hypodensities within the liver measure up to
1.6 cm in size. The spleen is unremarkable in appearance. The
pancreas and adrenal glands are within normal limits. The
gallbladder is grossly unremarkable.

The kidneys are unremarkable in appearance. There is no evidence of
hydronephrosis. No renal or ureteral stones are seen. No perinephric
stranding is appreciated.

No free fluid is identified. The small bowel is unremarkable in
appearance. The stomach is within normal limits. No acute vascular
abnormalities are seen.

The appendix is not definitely characterized; there is no evidence
of appendicitis. The colon is unremarkable in appearance.

The bladder is mildly distended and grossly unremarkable. The
prostate is normal in size. No inguinal lymphadenopathy is seen.

No acute osseous abnormalities are identified.
IMPRESSION: 1. Acute bowel perforation, with significant free intra-abdominal
air and a small to moderate amount of free fluid about the liver and
in the pelvis. This appears to arise from the anterior aspect of the
antrum of the stomach. An underlying ulceration cannot be excluded.
2. Mild bibasilar atelectasis or scarring noted.
3. Nonspecific hypodensities within the liver, measuring up to
cm in size. The LFTs remain normal; dynamic liver protocol MRI or CT
could be considered, on an elective nonemergent basis.

Critical Value/emergent results were called by telephone at the time
of interpretation on 02/26/2015 at [DATE] to Dr. RHOI VINCENT LICITRA,
who verbally acknowledged these results.

## 2017-08-17 ENCOUNTER — Encounter: Payer: Self-pay | Admitting: Physical Medicine & Rehabilitation

## 2017-08-17 ENCOUNTER — Encounter: Payer: Non-veteran care | Attending: Physical Medicine & Rehabilitation | Admitting: Physical Medicine & Rehabilitation

## 2017-08-17 ENCOUNTER — Other Ambulatory Visit: Payer: Self-pay

## 2017-08-17 VITALS — BP 143/98 | HR 72

## 2017-08-17 DIAGNOSIS — G479 Sleep disorder, unspecified: Secondary | ICD-10-CM | POA: Insufficient documentation

## 2017-08-17 DIAGNOSIS — Z8711 Personal history of peptic ulcer disease: Secondary | ICD-10-CM | POA: Diagnosis not present

## 2017-08-17 DIAGNOSIS — M791 Myalgia, unspecified site: Secondary | ICD-10-CM

## 2017-08-17 DIAGNOSIS — K219 Gastro-esophageal reflux disease without esophagitis: Secondary | ICD-10-CM | POA: Insufficient documentation

## 2017-08-17 DIAGNOSIS — M25511 Pain in right shoulder: Secondary | ICD-10-CM | POA: Diagnosis not present

## 2017-08-17 DIAGNOSIS — M542 Cervicalgia: Secondary | ICD-10-CM | POA: Diagnosis present

## 2017-08-17 DIAGNOSIS — Z8521 Personal history of malignant neoplasm of larynx: Secondary | ICD-10-CM | POA: Diagnosis not present

## 2017-08-17 DIAGNOSIS — F17210 Nicotine dependence, cigarettes, uncomplicated: Secondary | ICD-10-CM | POA: Insufficient documentation

## 2017-08-17 DIAGNOSIS — G8929 Other chronic pain: Secondary | ICD-10-CM | POA: Insufficient documentation

## 2017-08-17 MED ORDER — LIDOCAINE 5 % EX PTCH: 1.0000 | MEDICATED_PATCH | CUTANEOUS | 1 refills | Status: AC

## 2017-08-17 MED ORDER — GABAPENTIN 600 MG PO TABS: 600.0000 mg | ORAL_TABLET | Freq: Three times a day (TID) | ORAL | 1 refills | Status: AC

## 2017-08-17 MED ORDER — DULOXETINE HCL 30 MG PO CPEP: 30.0000 mg | ORAL_CAPSULE | Freq: Every day | ORAL | 1 refills | Status: AC

## 2017-08-17 MED ORDER — METHOCARBAMOL 750 MG PO TABS: 750.0000 mg | ORAL_TABLET | Freq: Two times a day (BID) | ORAL | 1 refills | Status: AC | PRN

## 2017-08-17 NOTE — Progress Notes (Signed)
Subjective:    Patient ID: Chad Wiggins, male    DOB: 04/18/1951, 66 y.o.   MRN: [id number]  HPI 66 y/o male with pmh of GERD, laryngeal CA s/p resection 02/2017 presents with neck pain.  Started ~2015.  Denies inciting event, but states he fell several times in the [employer].  Neck pain stable. Denies exacerbating factors.  Denies alleviating factors. Dull pain. ?Radiation to chest.  Intermittent. Denies associated numbness, weakness, tingling.  He was going to be referred to Neurosurg, but was found to have laryngeal CA.  Denies falls.  Pain limits work and use of RUE.    Pain Inventory Average Pain 7 Pain Right Now 7 My pain is constant and aching  In the last 24 hours, has pain interfered with the following? General activity 8 Relation with others 6 Enjoyment of life 9 What TIME of day is your pain at its worst? night Sleep (in general) Poor  Pain is worse with: . Pain improves with: medication Relief from Meds: 7  Mobility ability to climb steps?  yes do you drive?  yes Do you have any goals in this area?  no  Function disabled: date disabled 2017  Neuro/Psych weakness numbness tingling depression anxiety loss of taste or smell  Prior Studies Any changes since last visit?  no  Physicians involved in your care Any changes since last visit?  no   No family history on file. Social History   Socioeconomic History  . Marital status: Single    Spouse name: Not on file  . Number of children: Not on file  . Years of education: Not on file  . Highest education level: Not on file  Social Needs  . Financial resource strain: Not on file  . Food insecurity - worry: Not on file  . Food insecurity - inability: Not on file  . Transportation needs - medical: Not on file  . Transportation needs - non-medical: Not on file  Occupational History  . Not on file  Tobacco Use  . Smoking status: Current Every Day Smoker    Packs/day: 1.00    Types: Cigarettes  Substance  and Sexual Activity  . Alcohol use: Yes    Alcohol/week: 10.8 oz    Types: 18 Cans of beer per week  . Drug use: No  . Sexual activity: Not on file  Other Topics Concern  . Not on file  Social History Narrative  . Not on file   Past Surgical History:  Procedure Laterality Date  . APPENDECTOMY    . LAPAROTOMY N/A 02/26/2015   Pro[facility]: [facility];  Surgeon: Almond LintFaera Byerly, MD;  Location: [facility] OR;  Service: General;  Laterality: N/A;   Past Medical History:  Diagnosis Date  . Peptic ulcer disease 02/26/15  . Perforated ulcer 02/26/15   Perforated Gastric ulcer   BP (!) 143/98   Pulse 72   SpO2 94%   Opioid Risk Score:  0 Fall Risk Score:  `1  Depression screen PHQ 2/9  [facility] 08/17/2017 08/17/2017  Decreased Interest 3 3  Down, Depressed, Hopeless 3 3  PHQ - 2 Score 6 6  Altered sleeping - 3  Tired, decreased energy - 1  Change in appetite - 1  Feeling bad or failure about yourself  - 2  Trouble concentrating - 2  Moving slowly or fidgety/restless - 2  Suicidal thoughts - 0  PHQ-9 Score - 17  Difficult doing  work/chores - Somewhat difficult   Review of Systems  Constitutional: Positive for unexpected weight change.  HENT: Negative.   Eyes: Negative.   Respiratory: Negative.   Cardiovascular: Negative.   Gastrointestinal: Negative.   Endocrine: Negative.   Genitourinary: Negative.   Musculoskeletal: Positive for arthralgias, myalgias, neck pain and neck stiffness.  Skin: Negative.   Allergic/Immunologic: Negative.   Neurological: Negative.   Hematological: Negative.   Psychiatric/Behavioral: Negative.   All other systems reviewed and are negative.     Objective:   Physical Exam Gen: NAD. Vital signs reviewed HENT: Normocephalic, Atraumatic Eyes: EOMI. No discharge.  Cardio: RRR. No JVD. Pulm: B/l clear to auscultation.  Effort normal Abd: Soft, BS+ MSK:  Gait WNL.   No TTP.    No  edema.   ROM limited RUE due to shoulder pain Neuro: CN II-XII grossly intact.    Sensation intact to light touch in all UE dermatomes  Reflexes symmetric  Strength  4/5 in all RUE myotomes (pain inhibition)    5/5 in all LUE myotomes  Neg Spurling's b/l Skin: Warm and Dry. 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   1. Chronic neck pain with cervicalgia  MRI report reviewed (flims unavailable) suggesting cervical spondylosis with b/l foraminal narrowing C4-5 with impingement at C6-7 greatest.   Labs reviewed  Referral information reviewed  NCCSRS reviewed  Cont Heat  Will order TENS  Will increase Gabapentin to 600 TID  Will order Cymbalta 30mg   Will order Robaxin 750 BID PRN   Will order Lidoderm patches  Trial Voltaren gel  [facility]  Will consider referral to Psychology  Will consider Accupuncture  Will order EMG/NCS   2. Sleep disturbance  Will consider Elavil in future  3. Myalgia   Will consider trigger point injections   4. Right shoulder pain  Main complaint at present  Being worked up with plans for MRI

## 2017-08-31 ENCOUNTER — Encounter: Payer: Non-veteran care | Admitting: Physical Medicine & Rehabilitation

## 2017-09-14 ENCOUNTER — Encounter: Payer: Non-veteran care | Attending: Physical Medicine & Rehabilitation | Admitting: Physical Medicine & Rehabilitation

## 2017-09-14 DIAGNOSIS — M25511 Pain in right shoulder: Secondary | ICD-10-CM | POA: Insufficient documentation

## 2017-09-14 DIAGNOSIS — M542 Cervicalgia: Secondary | ICD-10-CM | POA: Insufficient documentation

## 2017-09-14 DIAGNOSIS — F17210 Nicotine dependence, cigarettes, uncomplicated: Secondary | ICD-10-CM | POA: Insufficient documentation

## 2017-09-14 DIAGNOSIS — K219 Gastro-esophageal reflux disease without esophagitis: Secondary | ICD-10-CM | POA: Insufficient documentation

## 2017-09-14 DIAGNOSIS — G8929 Other chronic pain: Secondary | ICD-10-CM | POA: Insufficient documentation

## 2017-09-14 DIAGNOSIS — Z8521 Personal history of malignant neoplasm of larynx: Secondary | ICD-10-CM | POA: Insufficient documentation

## 2017-09-14 DIAGNOSIS — G479 Sleep disorder, unspecified: Secondary | ICD-10-CM | POA: Insufficient documentation

## 2017-09-14 DIAGNOSIS — Z8711 Personal history of peptic ulcer disease: Secondary | ICD-10-CM | POA: Insufficient documentation
